# Patient Record
Sex: Female | Born: 1970 | Race: White | Hispanic: No | Marital: Married | Smoking: Never smoker
Health system: Southern US, Community
[De-identification: ages and names within clinical notes are randomized; demographics above are authoritative.]

## PROBLEM LIST (undated history)

## (undated) DIAGNOSIS — E2839 Other primary ovarian failure: Secondary | ICD-10-CM

## (undated) DIAGNOSIS — G43909 Migraine, unspecified, not intractable, without status migrainosus: Secondary | ICD-10-CM

## (undated) DIAGNOSIS — Z8669 Personal history of other diseases of the nervous system and sense organs: Secondary | ICD-10-CM

## (undated) DIAGNOSIS — O09529 Supervision of elderly multigravida, unspecified trimester: Secondary | ICD-10-CM

## (undated) DIAGNOSIS — R87619 Unspecified abnormal cytological findings in specimens from cervix uteri: Secondary | ICD-10-CM

## (undated) DIAGNOSIS — Z789 Other specified health status: Secondary | ICD-10-CM

## (undated) DIAGNOSIS — G43829 Menstrual migraine, not intractable, without status migrainosus: Secondary | ICD-10-CM

## (undated) DIAGNOSIS — Z9889 Other specified postprocedural states: Secondary | ICD-10-CM

## (undated) DIAGNOSIS — M810 Age-related osteoporosis without current pathological fracture: Secondary | ICD-10-CM

## (undated) DIAGNOSIS — E039 Hypothyroidism, unspecified: Secondary | ICD-10-CM

## (undated) DIAGNOSIS — R112 Nausea with vomiting, unspecified: Secondary | ICD-10-CM

## (undated) HISTORY — DX: Menstrual migraine, not intractable, without status migrainosus: G43.829

## (undated) HISTORY — DX: Personal history of other diseases of the nervous system and sense organs: Z86.69

## (undated) HISTORY — DX: Supervision of elderly multigravida, unspecified trimester: O09.529

## (undated) HISTORY — DX: Age-related osteoporosis without current pathological fracture: M81.0

## (undated) HISTORY — DX: Hypothyroidism, unspecified: E03.9

## (undated) HISTORY — PX: WISDOM TOOTH EXTRACTION: SHX21

## (undated) HISTORY — DX: Other primary ovarian failure: E28.39

## (undated) HISTORY — PX: GYNECOLOGIC CRYOSURGERY: SHX857

---

## 1998-09-06 ENCOUNTER — Other Ambulatory Visit: Admission: RE | Admit: 1998-09-06 | Discharge: 1998-09-06 | Payer: Self-pay | Admitting: *Deleted

## 1999-09-11 ENCOUNTER — Other Ambulatory Visit: Admission: RE | Admit: 1999-09-11 | Discharge: 1999-09-11 | Payer: Self-pay | Admitting: *Deleted

## 2000-09-19 ENCOUNTER — Other Ambulatory Visit: Admission: RE | Admit: 2000-09-19 | Discharge: 2000-09-19 | Payer: Self-pay | Admitting: *Deleted

## 2001-09-22 ENCOUNTER — Other Ambulatory Visit: Admission: RE | Admit: 2001-09-22 | Discharge: 2001-09-22 | Payer: Self-pay | Admitting: *Deleted

## 2002-09-28 ENCOUNTER — Other Ambulatory Visit: Admission: RE | Admit: 2002-09-28 | Discharge: 2002-09-28 | Payer: Self-pay | Admitting: *Deleted

## 2006-02-28 ENCOUNTER — Emergency Department (HOSPITAL_COMMUNITY): Admission: EM | Admit: 2006-02-28 | Discharge: 2006-02-28 | Payer: Self-pay | Admitting: Family Medicine

## 2007-02-15 ENCOUNTER — Emergency Department (HOSPITAL_COMMUNITY): Admission: EM | Admit: 2007-02-15 | Discharge: 2007-02-15 | Payer: Self-pay | Admitting: Family Medicine

## 2008-04-12 ENCOUNTER — Encounter: Admission: RE | Admit: 2008-04-12 | Discharge: 2008-04-12 | Payer: Self-pay | Admitting: Family Medicine

## 2009-06-29 ENCOUNTER — Encounter: Admission: RE | Admit: 2009-06-29 | Discharge: 2009-06-29 | Payer: Self-pay | Admitting: Obstetrics & Gynecology

## 2010-06-28 ENCOUNTER — Other Ambulatory Visit: Payer: Self-pay | Admitting: Obstetrics & Gynecology

## 2010-06-28 DIAGNOSIS — Z1231 Encounter for screening mammogram for malignant neoplasm of breast: Secondary | ICD-10-CM

## 2010-07-05 ENCOUNTER — Ambulatory Visit
Admission: RE | Admit: 2010-07-05 | Discharge: 2010-07-05 | Disposition: A | Discharge status: Home / Self Care | Payer: BC Managed Care – PPO | Source: Ambulatory Visit | Attending: Obstetrics & Gynecology | Admitting: Obstetrics & Gynecology

## 2010-07-05 DIAGNOSIS — Z1231 Encounter for screening mammogram for malignant neoplasm of breast: Secondary | ICD-10-CM

## 2010-07-25 ENCOUNTER — Other Ambulatory Visit (HOSPITAL_COMMUNITY): Payer: Self-pay | Admitting: Obstetrics & Gynecology

## 2010-07-25 DIAGNOSIS — N979 Female infertility, unspecified: Secondary | ICD-10-CM

## 2010-07-31 ENCOUNTER — Ambulatory Visit (HOSPITAL_COMMUNITY)
Admission: RE | Admit: 2010-07-31 | Discharge: 2010-07-31 | Disposition: A | Discharge status: Home / Self Care | Payer: BC Managed Care – PPO | Source: Ambulatory Visit | Attending: Obstetrics & Gynecology | Admitting: Obstetrics & Gynecology

## 2010-07-31 DIAGNOSIS — N979 Female infertility, unspecified: Secondary | ICD-10-CM | POA: Insufficient documentation

## 2010-11-01 ENCOUNTER — Encounter: Payer: Self-pay | Admitting: Obstetrics & Gynecology

## 2010-11-09 ENCOUNTER — Encounter: Payer: Self-pay | Admitting: Obstetrics & Gynecology

## 2010-11-26 ENCOUNTER — Inpatient Hospital Stay (HOSPITAL_COMMUNITY): Payer: BC Managed Care – PPO

## 2010-11-26 ENCOUNTER — Inpatient Hospital Stay (HOSPITAL_COMMUNITY)
Admission: AD | Admit: 2010-11-26 | Discharge: 2010-11-26 | Disposition: A | Discharge status: Home / Self Care | Payer: BC Managed Care – PPO | Source: Ambulatory Visit | Attending: Obstetrics and Gynecology | Admitting: Obstetrics and Gynecology

## 2010-11-26 ENCOUNTER — Encounter (HOSPITAL_COMMUNITY): Payer: Self-pay | Admitting: *Deleted

## 2010-11-26 DIAGNOSIS — O26859 Spotting complicating pregnancy, unspecified trimester: Secondary | ICD-10-CM | POA: Insufficient documentation

## 2010-11-26 HISTORY — DX: Unspecified abnormal cytological findings in specimens from cervix uteri: R87.619

## 2010-11-26 HISTORY — DX: Migraine, unspecified, not intractable, without status migrainosus: G43.909

## 2010-11-26 NOTE — Progress Notes (Signed)
Has had several u/s so far, has h/o MAB, has established IUP with cardiac activity on 11/09/10.  Bleeding began today at 1530, cramping.

## 2010-11-26 NOTE — H&P (Signed)
CC; spotting HPI: 40 yo w/ desired Clomid preg, G2P0 and h/o 5 wk MAB presents w/ spotting and minimal cramping. Pt w/ documented viable IUP around 6 wks but given new sx, concerned about viability of preg. Pain controlled, bleeding minimal. No HA, CP, dizziness.  Meds: oral progesterone, 81 mg ASA NKDA PMH: none per pt  PE: Filed Vitals:   11/26/10 1641  BP: 122/76  Pulse: 80  Temp: 98.4 F (36.9 C)  Resp: 18   Gen: well appearing CV: RRR Pulm: CTAB Abd: soft, NT, ND GU: cvx long/ closed/ 1 scopette dark brown blood, no active bleeding. No adnexal masses, no cervical or uterine tenderness LE: NT, no edema  Rh pos (from office) U/s: Viable IUP. 1.5 cm [facility]  A/P: 40 yo G2P0 w/ h/o 5 wk MAB here at 9 wks by early u/s w/ spotting and viable IUP w/ [facility] - reassurance given. - f/u in office 1 wk  Djon Tith A. 11/26/2010 6:59 PM

## 2010-11-26 NOTE — Progress Notes (Signed)
Pt reports having some spotting/bleeding when she wiped after going to the bathroom. Also report mild cramping.

## 2010-12-06 ENCOUNTER — Other Ambulatory Visit: Payer: Self-pay

## 2010-12-07 LAB — GC/CHLAMYDIA PROBE AMP, GENITAL: Gonorrhea: NEGATIVE

## 2010-12-07 LAB — ABO/RH: RH Type: POSITIVE

## 2010-12-19 ENCOUNTER — Other Ambulatory Visit (HOSPITAL_COMMUNITY): Payer: Self-pay | Admitting: Obstetrics & Gynecology

## 2010-12-19 DIAGNOSIS — O09529 Supervision of elderly multigravida, unspecified trimester: Secondary | ICD-10-CM

## 2010-12-19 DIAGNOSIS — O289 Unspecified abnormal findings on antenatal screening of mother: Secondary | ICD-10-CM

## 2010-12-21 ENCOUNTER — Encounter (HOSPITAL_COMMUNITY): Payer: Self-pay | Admitting: *Deleted

## 2010-12-21 ENCOUNTER — Ambulatory Visit (HOSPITAL_COMMUNITY)
Admission: RE | Admit: 2010-12-21 | Discharge: 2010-12-21 | Disposition: A | Discharge status: Home / Self Care | Payer: BC Managed Care – PPO | Source: Ambulatory Visit | Attending: Obstetrics & Gynecology | Admitting: Obstetrics & Gynecology

## 2010-12-21 ENCOUNTER — Encounter (HOSPITAL_COMMUNITY): Payer: Self-pay | Admitting: Pharmacist

## 2010-12-21 ENCOUNTER — Encounter (HOSPITAL_COMMUNITY): Payer: Self-pay

## 2010-12-21 ENCOUNTER — Encounter: Payer: Self-pay | Admitting: Obstetrics and Gynecology

## 2010-12-21 DIAGNOSIS — O3440 Maternal care for other abnormalities of cervix, unspecified trimester: Secondary | ICD-10-CM | POA: Insufficient documentation

## 2010-12-21 DIAGNOSIS — O09529 Supervision of elderly multigravida, unspecified trimester: Secondary | ICD-10-CM | POA: Insufficient documentation

## 2010-12-21 DIAGNOSIS — O289 Unspecified abnormal findings on antenatal screening of mother: Secondary | ICD-10-CM

## 2010-12-21 NOTE — Progress Notes (Signed)
Genetic Counseling  High-Risk Gestation Note  Appointment Date:  12/21/2010 Referred By: Genia Del, MD Date of Birth:  January 06, 1971 Partner:  Tiffany Webb   Pregnancy History: [id number] Estimated Date of Delivery: 06/29/11 Estimated Gestational Age: [redacted]w[redacted]d Attending: Dario Ave, MD   [name] and her partner, [name] were seen for genetic counseling regarding a screen positive Down syndrome risk from [facility] (cell free fetal DNA testing). The patient will be 40 years old at delivery.   They were counseled regarding maternal age and the association with risk for chromosome conditions due to nondisjunction with aging of the ova.   We reviewed chromosomes, nondisjunction, and the associated 1 in 13 risk for fetal aneuploidy in the first trimester related to a maternal age of 59 at delivery.  They were counseled that the risk for aneuploidy decreases as gestational age increases, accounting for those pregnancies which spontaneously abort.  We specifically discussed Down syndrome (trisomy 75), trisomies 93 and 48, and sex chromosome aneuploidies (47,XXX and 47,XXY) including the common features and prognoses of each.   We reviewed Tiffany Webb's positive non-invasive prenatal testing (NIPT) [facility] (circulating cell free fetal DNA) screen result and the associated increase in risk for fetal Down syndrome of >99%.  In addition, we reviewed the less than 1 in 10,000 risk for trisomy 53 and trisomy 13 from this screen. This screen utilizes cell free fetal DNA which is found in the maternal circulation. This test is not diagnostic for chromosome conditions, but can provide information regarding the presence or absence of extra fetal DNA for chromosomes 13, 18 and 21. The reported detection rate for Trisomy 21, and Trisomy 18 is greater than 99%, and is approximately 91% for Trisomy 13. The false positive rate is thought to be less than 1% for any of these conditions.     They were  counseled regarding diagnostic options including CVS and amniocentesis.  The risks, benefits, and limitations of each of these options were reviewed in detail. A risk of 1 in 100 was given for CVS and 1 in 200-300 was given for amniocentesis, the primary concern being spontaneous pregnancy loss. We also reviewed screening option of detailed ultrasound performed in the second trimester. Ultrasound cannot diagnose or rule out chromosome conditions. They understand that these options cannot identify or rule out all birth defects or genetic conditions.  After thoughtful consideration of these options, they elected to proceed with chorionic villus sampling (CVS) at the time of today's visit. FISH analysis was requested.   An ultrasound was performed today.  The ultrasound report will be sent under separate cover.    Once a pregnancy is found to have Down syndrome, there are different ways to proceed with the pregnancy. If the pregnancy is continued, it would likely be followed via ultrasounds and a fetal echocardiogram (detailed ultrasound of the fetal heart). Some parents choose to pursue adoption as an option. It is also an option to end the pregnancy until [redacted] weeks gestation in the state of [place]. The couple stated that they are planning to pursue termination of the pregnancy in the event that a diagnosis of Down syndrome is determined from CVS.   Tiffany Webb was provided with written information regarding cystic fibrosis (CF) including the carrier frequency and incidence in the Caucasian population, the availability of carrier testing and prenatal diagnosis if indicated.  In addition, we discussed that CF is routinely screened for as part of the [place] newborn screening panel.  She elected  to proceed with CF carrier screening today.   Both family histories were reviewed and found to be noncontributory for birth defects, mental retardation, and known genetic conditions. Without further information  regarding the provided family history, an accurate genetic risk cannot be calculated. Further genetic counseling is warranted if more information is obtained.  Tiffany Webb denied exposure to environmental toxins or chemical agents. She denied the use of alcohol, tobacco or street drugs. She denied significant viral illnesses during the course of her pregnancy. Her medical and surgical histories were noncontributory.     I counseled this couple regarding the above risks and available options.  The approximate face-to-face time with the genetic counselor was 40 minutes.    Tiffany Braun Charlena Haub, MS, [facility] 12/21/2010

## 2010-12-21 NOTE — Progress Notes (Signed)
Report in [facility]/EPIC; follow-up as needed 

## 2010-12-21 NOTE — Progress Notes (Signed)
Report in [facility]/EPIC; follow-up as needed Transabdominal CVS performed without apparent abnormalities.

## 2010-12-22 ENCOUNTER — Telehealth (HOSPITAL_COMMUNITY): Payer: Self-pay | Admitting: MS"

## 2010-12-22 NOTE — Telephone Encounter (Signed)
Called Tiffany Webb to discuss the preliminary FISH results from her CVS. We reviewed that these are within normal limits. Patient gave permission to disclose fetal gender. Reported apparently female.  Patient expressed surprise that this is not consistent with NIPT ([facility]) result. She previously had termination planned for Thursday given expectation that CVS would confirm NIPT result. She is planning to cancel this appointment given that the final result will not likely be available prior to next Thursday.    We discussed that the maternal cell contamination studies are being performed and are anticipated to result next week. We again discussed the limitations of FISH and that final results are still pending and will be available in approximately 7-10 days.  All questions were answered to her satisfaction, she was encouraged to call with additional questions or concerns.  Quinn Plowman MS Patent attorney

## 2010-12-27 ENCOUNTER — Telehealth (HOSPITAL_COMMUNITY): Payer: Self-pay | Admitting: MS"

## 2010-12-27 NOTE — Telephone Encounter (Signed)
Maternal cell contamination molecular studies negative, indicating that the cells analyzed from CVS sample are fetal in origin. Result to patient. Final karyotype pending.

## 2010-12-28 ENCOUNTER — Ambulatory Visit (HOSPITAL_COMMUNITY)
Admission: RE | Admit: 2010-12-28 | Payer: BC Managed Care – PPO | Source: Ambulatory Visit | Admitting: Obstetrics & Gynecology

## 2010-12-28 ENCOUNTER — Encounter (HOSPITAL_COMMUNITY): Admission: RE | Payer: Self-pay | Source: Ambulatory Visit

## 2010-12-28 HISTORY — DX: Other specified health status: Z78.9

## 2010-12-28 HISTORY — DX: Other specified postprocedural states: Z98.890

## 2010-12-28 HISTORY — DX: Nausea with vomiting, unspecified: R11.2

## 2010-12-28 SURGERY — DILATION AND EVACUATION, UTERUS, SECOND TRIMESTER
Anesthesia: Choice

## 2011-01-01 ENCOUNTER — Telehealth (HOSPITAL_COMMUNITY): Payer: Self-pay | Admitting: MS"

## 2011-01-01 ENCOUNTER — Other Ambulatory Visit (HOSPITAL_COMMUNITY): Payer: Self-pay | Admitting: Obstetrics & Gynecology

## 2011-01-01 DIAGNOSIS — O09529 Supervision of elderly multigravida, unspecified trimester: Secondary | ICD-10-CM

## 2011-01-01 NOTE — Telephone Encounter (Signed)
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 Discussed with patient that this indicates that the majority of cells analyzed consistent with normal female chromosomes. This is estimated to be less than 5% mosaicism for trisomy 28. 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 It could also indicate confined placental mosaicism for a +21 cell line. We discussed the option of amniocentesis for chromosome and FISH analysis to confirm results. Patient expressed interest in amniocentesis. We reviewed the options of early amniocentesis (prior to 15 weeks) and amniocentesis after [redacted] weeks gestation. The risks, benefits, and limitations were reviewed. Patient understands that the risk of complications from amniocentesis is higher if performed prior to [redacted] weeks gestation. After careful consideration, [name] elected to return to our office for amniocentesis tomorrow (01/02/11).

## 2011-01-02 ENCOUNTER — Ambulatory Visit (HOSPITAL_COMMUNITY)
Admission: RE | Admit: 2011-01-02 | Discharge: 2011-01-02 | Disposition: A | Discharge status: Home / Self Care | Payer: BC Managed Care – PPO | Source: Ambulatory Visit | Attending: Obstetrics & Gynecology | Admitting: Obstetrics & Gynecology

## 2011-01-02 DIAGNOSIS — O3440 Maternal care for other abnormalities of cervix, unspecified trimester: Secondary | ICD-10-CM | POA: Insufficient documentation

## 2011-01-02 DIAGNOSIS — O09529 Supervision of elderly multigravida, unspecified trimester: Secondary | ICD-10-CM | POA: Insufficient documentation

## 2011-01-03 ENCOUNTER — Encounter (HOSPITAL_COMMUNITY): Payer: Self-pay | Admitting: Pharmacist

## 2011-01-03 ENCOUNTER — Telehealth (HOSPITAL_COMMUNITY): Payer: Self-pay | Admitting: MS"

## 2011-01-03 ENCOUNTER — Other Ambulatory Visit: Payer: Self-pay | Admitting: Obstetrics & Gynecology

## 2011-01-03 NOTE — Telephone Encounter (Signed)
Called Tiffany Webb to discuss the preliminary FISH results from her amniocentesis We reviewed that these indicate 18% mosaicism for trisomy 47. We reviewed that this result indicates mosaicism in the fetus. We again discussed the limitations of FISH and that final results are still pending and will be available in 1-2 weeks.   We discussed that there can be wide variability in the features that may or may not be present with mosaic Down syndrome. We discussed the limitations in predicting phenotype prenatally in the case of mosaic Trisomy 21.  Typically, a higher percentage of trisomic cells (high level of mosaicism) would increase the risk for associated features in the baby.  Chromosome mosaicism increases the risk for miscarriage as well; higher levels of mosaicism would be associated with a higher chance of miscarriage.  However, the limitation is that the level of mosaicism can be different in different tissue types (ie. blood cells, heart cells, brain cells, etc.), which we are unable to predict. Patient indicated that she was planning to proceed with termination of pregnancy. She planned to contact her OB office for follow-up.  All questions were answered to her satisfaction, she was encouraged to call with additional questions or concerns.

## 2011-01-05 ENCOUNTER — Encounter (HOSPITAL_COMMUNITY): Payer: Self-pay

## 2011-01-08 ENCOUNTER — Encounter (HOSPITAL_COMMUNITY): Payer: Self-pay | Admitting: Certified Registered"

## 2011-01-08 ENCOUNTER — Ambulatory Visit (HOSPITAL_COMMUNITY)
Admission: RE | Admit: 2011-01-08 | Discharge: 2011-01-08 | Disposition: A | Discharge status: Home / Self Care | Payer: BC Managed Care – PPO | Source: Ambulatory Visit | Attending: Obstetrics & Gynecology | Admitting: Obstetrics & Gynecology

## 2011-01-08 ENCOUNTER — Ambulatory Visit (HOSPITAL_COMMUNITY): Payer: BC Managed Care – PPO | Admitting: Certified Registered"

## 2011-01-08 ENCOUNTER — Ambulatory Visit (HOSPITAL_COMMUNITY): Payer: BC Managed Care – PPO

## 2011-01-08 ENCOUNTER — Other Ambulatory Visit: Payer: Self-pay | Admitting: Obstetrics & Gynecology

## 2011-01-08 ENCOUNTER — Encounter (HOSPITAL_COMMUNITY): Payer: Self-pay | Admitting: *Deleted

## 2011-01-08 ENCOUNTER — Encounter (HOSPITAL_COMMUNITY)
Admission: RE | Disposition: A | Discharge status: Home / Self Care | Payer: Self-pay | Source: Ambulatory Visit | Attending: Obstetrics & Gynecology

## 2011-01-08 DIAGNOSIS — O039 Complete or unspecified spontaneous abortion without complication: Secondary | ICD-10-CM | POA: Insufficient documentation

## 2011-01-08 DIAGNOSIS — O3510X0 Maternal care for (suspected) chromosomal abnormality in fetus, unspecified, not applicable or unspecified: Secondary | ICD-10-CM | POA: Insufficient documentation

## 2011-01-08 DIAGNOSIS — O351XX0 Maternal care for (suspected) chromosomal abnormality in fetus, not applicable or unspecified: Secondary | ICD-10-CM | POA: Insufficient documentation

## 2011-01-08 HISTORY — PX: DILATION AND EVACUATION: SHX1459

## 2011-01-08 LAB — CBC
Hemoglobin: 11.4 g/dL — ABNORMAL LOW (ref 12.0–15.0)
MCH: 31 pg (ref 26.0–34.0)
MCHC: 33.1 g/dL (ref 30.0–36.0)

## 2011-01-08 LAB — ABO/RH: ABO/RH(D): AB POS

## 2011-01-08 SURGERY — DILATION AND EVACUATION, UTERUS, SECOND TRIMESTER
Anesthesia: Choice | Site: Vagina | Wound class: Clean Contaminated

## 2011-01-08 MED ORDER — LIDOCAINE HCL 1 % IJ SOLN
INTRAMUSCULAR | Status: DC | PRN
  Administered 2011-01-08: 20 mL

## 2011-01-08 MED ORDER — FENTANYL CITRATE 0.05 MG/ML IJ SOLN
INTRAMUSCULAR | Status: DC | PRN
  Administered 2011-01-08 (×4): 25 ug via INTRAVENOUS

## 2011-01-08 MED ORDER — FENTANYL CITRATE 0.05 MG/ML IJ SOLN
INTRAMUSCULAR | Status: AC
  Filled 2011-01-08: qty 2

## 2011-01-08 MED ORDER — HYDROCODONE-ACETAMINOPHEN 5-500 MG PO TABS: 1.0000 | ORAL_TABLET | Freq: Four times a day (QID) | ORAL | Status: AC | PRN

## 2011-01-08 MED ORDER — KETOROLAC TROMETHAMINE 30 MG/ML IJ SOLN
INTRAMUSCULAR | Status: AC
  Filled 2011-01-08: qty 1

## 2011-01-08 MED ORDER — GLYCOPYRROLATE 0.2 MG/ML IJ SOLN
INTRAMUSCULAR | Status: DC | PRN
  Administered 2011-01-08: .15 mg via INTRAVENOUS

## 2011-01-08 MED ORDER — LIDOCAINE HCL (CARDIAC) 20 MG/ML IV SOLN
INTRAVENOUS | Status: AC
  Filled 2011-01-08: qty 5

## 2011-01-08 MED ORDER — HYDROMORPHONE HCL PF 1 MG/ML IJ SOLN
INTRAMUSCULAR | Status: AC
  Filled 2011-01-08: qty 1

## 2011-01-08 MED ORDER — LACTATED RINGERS IV SOLN
INTRAVENOUS | Status: DC
  Administered 2011-01-08 (×2): via INTRAVENOUS

## 2011-01-08 MED ORDER — GLYCOPYRROLATE 0.2 MG/ML IJ SOLN
INTRAMUSCULAR | Status: AC
  Filled 2011-01-08: qty 1

## 2011-01-08 MED ORDER — ONDANSETRON HCL 4 MG/2ML IJ SOLN
INTRAMUSCULAR | Status: DC | PRN
  Administered 2011-01-08: 4 mg via INTRAVENOUS

## 2011-01-08 MED ORDER — MIDAZOLAM HCL 5 MG/5ML IJ SOLN
INTRAMUSCULAR | Status: DC | PRN
  Administered 2011-01-08: 2 mg via INTRAVENOUS

## 2011-01-08 MED ORDER — METOCLOPRAMIDE HCL 5 MG/ML IJ SOLN: 10.0000 mg | Freq: Once | INTRAMUSCULAR | Status: DC | PRN

## 2011-01-08 MED ORDER — SCOPOLAMINE 1 MG/3DAYS TD PT72
1.0000 | MEDICATED_PATCH | Freq: Once | TRANSDERMAL | Status: DC
  Administered 2011-01-08: 1.5 mg via TRANSDERMAL

## 2011-01-08 MED ORDER — PROPOFOL 10 MG/ML IV EMUL
INTRAVENOUS | Status: AC
  Filled 2011-01-08: qty 20

## 2011-01-08 MED ORDER — CEFAZOLIN SODIUM 1-5 GM-% IV SOLN
INTRAVENOUS | Status: DC | PRN
  Administered 2011-01-08: 1 g via INTRAVENOUS

## 2011-01-08 MED ORDER — ONDANSETRON HCL 4 MG/2ML IJ SOLN
INTRAMUSCULAR | Status: AC
  Filled 2011-01-08: qty 2

## 2011-01-08 MED ORDER — DEXAMETHASONE SODIUM PHOSPHATE 10 MG/ML IJ SOLN
INTRAMUSCULAR | Status: AC
  Filled 2011-01-08: qty 1

## 2011-01-08 MED ORDER — MIDAZOLAM HCL 2 MG/2ML IJ SOLN
INTRAMUSCULAR | Status: AC
  Filled 2011-01-08: qty 2

## 2011-01-08 MED ORDER — FENTANYL CITRATE 0.05 MG/ML IJ SOLN: 25.0000 ug | INTRAMUSCULAR | Status: DC | PRN

## 2011-01-08 MED ORDER — SCOPOLAMINE 1 MG/3DAYS TD PT72
MEDICATED_PATCH | TRANSDERMAL | Status: AC
  Administered 2011-01-08: 1.5 mg via TRANSDERMAL
  Filled 2011-01-08: qty 1

## 2011-01-08 MED ORDER — DEXAMETHASONE SODIUM PHOSPHATE 4 MG/ML IJ SOLN
INTRAMUSCULAR | Status: DC | PRN
  Administered 2011-01-08: 4 mg via INTRAVENOUS

## 2011-01-08 MED ORDER — LIDOCAINE HCL (CARDIAC) 20 MG/ML IV SOLN
INTRAVENOUS | Status: DC | PRN
  Administered 2011-01-08: 50 mg via INTRAVENOUS

## 2011-01-08 MED ORDER — PROPOFOL 10 MG/ML IV EMUL
INTRAVENOUS | Status: DC | PRN
  Administered 2011-01-08: 30 mg via INTRAVENOUS
  Administered 2011-01-08: 170 mg via INTRAVENOUS

## 2011-01-08 SURGICAL SUPPLY — 17 items
CLOTH BEACON ORANGE TIMEOUT ST (SAFETY) ×2 IMPLANT
CONT PATH 16OZ SNAP LID 3702 (MISCELLANEOUS) ×2 IMPLANT
DRAPE STERI URO 9X17 APER PCH (DRAPES) IMPLANT
GLOVE BIO SURGEON STRL SZ 6.5 (GLOVE) ×4 IMPLANT
GOWN PREVENTION PLUS LG XLONG (DISPOSABLE) ×4 IMPLANT
KIT BERKELEY 2ND TRIMESTER 1/2 (COLLECTOR) ×2 IMPLANT
NEEDLE SPNL 22GX3.5 QUINCKE BK (NEEDLE) ×2 IMPLANT
PACK VAGINAL MINOR WOMEN LF (CUSTOM PROCEDURE TRAY) ×2 IMPLANT
PAD PREP 24X48 CUFFED NSTRL (MISCELLANEOUS) ×2 IMPLANT
SYR CONTROL 10ML LL (SYRINGE) ×2 IMPLANT
TOP DISP BERKELEY (MISCELLANEOUS) ×2 IMPLANT
TOWEL OR 17X24 6PK STRL BLUE (TOWEL DISPOSABLE) ×4 IMPLANT
TUBE VACURETTE 2ND TRIMESTER (CANNULA) ×2 IMPLANT
VACURETTE 12 RIGID CVD (CANNULA) IMPLANT
VACURETTE 14MM CVD 1/2 BASE (CANNULA) IMPLANT
VACURETTE 16MM ASPIR CVD .5 (CANNULA) ×2 IMPLANT
WATER STERILE IRR 1000ML POUR (IV SOLUTION) ×2 IMPLANT

## 2011-01-08 NOTE — Op Note (Signed)
01/08/2011  11:42 AM  PATIENT:  Tiffany Webb  40 y.o. 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  She is prepped with Betadine on the suprapubic, vulvar and vaginal areas and draped as usual. A dose of Ancef 1 g IV was given. The vaginal exam revealed an anteverted uterus about 15 cm, mobile, no adnexal mass and the cervix appeared closed and long but was soft.  The patient was prepared with Cytotec 800 mcg intravaginally on November 25 at bed time.  The weighted speculum was introduced in the vagina. The anterior lip of the cervix was grasped with a tenaculum. A paracervical block was done at 4:00 and 8:00 with lidocaine 1%, 20 cc.  The cervix was dilated with Hegar dilators without any resistance up to #57. A #16 curved suction curette was used. The the dilation and extraction was done under ultrasound guidance.  We started with the suction curette, then used a fenestrated large clamp and very gently used the large sharp curet.  We finally I used the suction curette again to assure a thin endometrial lining at the end. The uterine cavity was confirmed to be empty  by ultrasound.  All instruments were removed. The uterus was contracting well and hemostasis was adequate. The patient was brought to recovery room in good stable status.  ESTIMATED BLOOD LOSS: 150 cc   Intake/Output Summary (Last 24 hours) at 01/08/11 1142 Last data filed at 01/08/11 1122  Gross per 24 hour  Intake   1000 ml  Output    200 ml  Net    800 ml     BLOOD ADMINISTERED:none   LOCAL MEDICATIONS USED:  LIDOCAINE 1 % 20 CC  SPECIMEN:  Source of Specimen:   Products of conception  DISPOSITION OF SPECIMEN:  PATHOLOGY  COUNTS:  YES   PLAN OF CARE: Transfer to PACU  [name]  01/08/11 at 11:45 am

## 2011-01-08 NOTE — Anesthesia Preprocedure Evaluation (Addendum)
Anesthesia Evaluation  Patient identified by MRN, date of birth, ID band Patient awake    Reviewed: Allergy & Precautions, H&P , NPO status , Patient's Chart, lab work & pertinent test results  History of Anesthesia Complications (+) PONV  Airway Mallampati: I TM Distance: >3 FB Neck ROM: full    Dental No notable dental hx. (+) Teeth Intact   Pulmonary neg pulmonary ROS,    Pulmonary exam normal       Cardiovascular neg cardio ROS     Neuro/Psych Negative Neurological ROS  Negative Psych ROS   GI/Hepatic negative GI ROS, Neg liver ROS,   Endo/Other  Negative Endocrine ROS  Renal/GU negative Renal ROS  Genitourinary negative   Musculoskeletal negative musculoskeletal ROS (+)   Abdominal Normal abdominal exam  (+)   Peds negative pediatric ROS (+)  Hematology negative hematology ROS (+)   Anesthesia Other Findings   Reproductive/Obstetrics negative OB ROS                           Anesthesia Physical Anesthesia Plan  ASA: II  Anesthesia Plan: General   Post-op Pain Management:    Induction: Intravenous  Airway Management Planned: LMA  Additional Equipment:   Intra-op Plan:   Post-operative Plan:   Informed Consent: I have reviewed the patients History and Physical, chart, labs and discussed the procedure including the risks, benefits and alternatives for the proposed anesthesia with the patient or authorized representative who has indicated his/her understanding and acceptance.     Plan Discussed with: CRNA  Anesthesia Plan Comments:         Anesthesia Quick Evaluation  

## 2011-01-08 NOTE — Anesthesia Postprocedure Evaluation (Signed)
Anesthesia Post Note  Patient: Tiffany Webb  Procedure(s) Performed:  DILATATION AND EVACUATION (D&E) 2ND TRIMESTER  Anesthesia type: General  Patient location: PACU  Post pain: Pain level controlled  Post assessment: Post-op Vital signs reviewed  Last Vitals:  Filed Vitals:   01/08/11 1200  BP: 121/76  Pulse: 80  Temp:   Resp: 16    Post vital signs: Reviewed  Level of consciousness: sedated  Complications: No apparent anesthesia complicationsfj

## 2011-01-08 NOTE — Discharge Summary (Signed)
  Physician Discharge Summary  Patient ID: Tiffany Webb MRN: [id number] DOB/AGE: 1970-05-14 40 y.o.  Admit date: 01/08/2011 Discharge date: 01/08/2011  Admission Diagnoses: Elective  Discharge Diagnoses: Elective        Active Problems:  * No active hospital problems. *    Discharged Condition: good  Hospital Course:   Consults: none  Treatments: surgery: D+Extraction  Disposition: Home or Self Care  Discharge Orders    Future Appointments: Provider: Department: Dept Phone: Center:   01/11/2011 9:00 AM [facility] [place] [facility] [phone] [facility]     Current Discharge Medication List    START taking these medications   Details  HYDROcodone-acetaminophen (VICODIN) 5-500 MG per tablet Take 1 tablet by mouth every 6 (six) hours as needed for pain. Qty: 30 tablet, Refills: 0      CONTINUE these medications which have NOT CHANGED   Details  acetaminophen (TYLENOL) 500 MG tablet Take 500 mg by mouth every 6 (six) hours as needed. For pain.     Prenatal-Na Feredta-FA-Omega 3 (CAVAN-EC SOD DHA) 30-1 & 440 MG MISC Take 2 tablets by mouth Daily. 1 tablet is PNV; 1 gelcap = DHA       Follow-up Information    Follow up with Tiffany Webb,Tiffany Webb in 3 weeks.   Contact information:   [address] [place] [address] [phone]          Signed: Genia Del 01/08/2011, 12:01 PM

## 2011-01-08 NOTE — Transfer of Care (Signed)
Immediate Anesthesia Transfer of Care Note  Patient: Tiffany Webb  Procedure(s) Performed:  DILATATION AND EVACUATION (D&E) 2ND TRIMESTER  Patient Location: PACU  Anesthesia Type: General  Level of Consciousness: awake, alert  and oriented  Airway & Oxygen Therapy: Patient Spontanous Breathing and Patient connected to face mask oxygen  Post-op Assessment: Report given to PACU RN and Post -op Vital signs reviewed and stable  Post vital signs: Reviewed and stable  Complications: No apparent anesthesia complications

## 2011-01-08 NOTE — H&P (Signed)
Tiffany Webb is an 40 y.o. female G2P0010 at 28 weeks  RP:  D+Extraction re fetus with Down Syndrome mosaicism  HPI:  Crampy and spotting post Cytotec 800 microgram intravaginally yesterday HS. Pertinent Gynecological History:  Blood transfusions: none OB History: G2P0010   Menstrual History:  Patient's last menstrual period was 09/15/2010.    Past Medical History  Diagnosis Date  . Migraines   . Abnormal Pap smear of cervix   . No pertinent past medical history   . PONV (postoperative nausea and vomiting)     Past Surgical History  Procedure Date  . Gynecologic cryosurgery   . Wisdom tooth extraction     No family history on file.  Social History:  reports that she has never smoked. She has never used smokeless tobacco. She reports that she does not drink alcohol or use illicit drugs.  Allergies:  Allergies  Allergen Reactions  . Avocado     GI upset   . Food Other (See Comments)    Pine nuts-cause some GI upset    Prescriptions prior to admission  Medication Sig Dispense Refill  . acetaminophen (TYLENOL) 500 MG tablet Take 500 mg by mouth every 6 (six) hours as needed. For pain.       [place] Feredta-FA-Omega 3 (CAVAN-EC SOD DHA) 30-1 & 440 MG MISC Take 2 tablets by mouth Daily. 1 tablet is PNV; 1 gelcap = DHA        Blood pressure 104/74, pulse 77, temperature 98.2 F (36.8 C), temperature source Oral, resp. rate 18, height 5\' 4"  (1.626 m), weight 54.432 kg (120 lb), last menstrual period 09/15/2010, SpO2 100.00%.  Uterus gravid 15 wks Amnio  Mosaicism Down Syndrome   Results for orders placed during the hospital encounter of 01/08/11 (from the past 24 hour(s))  ABO/RH     Status: Normal   Collection Time   01/08/11  8:58 AM      Component Value Range   ABO/RH(D) AB POS    CBC     Status: Abnormal   Collection Time   01/08/11  8:59 AM      Component Value Range   WBC 7.7  4.0 - 10.5 (K/uL)   RBC 3.68 (*) 3.87 - 5.11 (MIL/uL)   Hemoglobin  11.4 (*) 12.0 - 15.0 (g/dL)   HCT 45.4 (*) 09.8 - 46.0 (%)   MCV 93.5  78.0 - 100.0 (fL)   MCH 31.0  26.0 - 34.0 (pg)   MCHC 33.1  30.0 - 36.0 (g/dL)   RDW 11.9  14.7 - 82.9 (%)   Platelets 272  150 - 400 (K/uL)    No results found.  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  Surgery and risks reviewed.  Gergory Biello,MARIE-LYNE 01/08/2011, 10:46 AM

## 2011-01-08 NOTE — Preoperative (Signed)
Beta Blockers   Reason not to administer Beta Blockers:Not Applicable 

## 2011-01-09 ENCOUNTER — Encounter (HOSPITAL_COMMUNITY): Payer: Self-pay | Admitting: Obstetrics & Gynecology

## 2011-01-11 ENCOUNTER — Other Ambulatory Visit (HOSPITAL_COMMUNITY): Payer: BC Managed Care – PPO

## 2011-01-17 LAB — US OB COMP LESS 14 WKS

## 2011-01-22 ENCOUNTER — Other Ambulatory Visit: Payer: Self-pay

## 2011-01-25 ENCOUNTER — Telehealth (HOSPITAL_COMMUNITY): Payer: Self-pay | Admitting: MS"

## 2011-01-25 NOTE — Telephone Encounter (Signed)
Called [name] with cystic fibrosis carrier screening results. Carrier screening for cystic fibrosis yielded a normal/negative result for the 97 most common disease-causing mutations, meaning that the risk to be a CF carrier can be reduced from 1 in 25 to 1 in 343.   The patient inquired about additional testing that would be indicated to be performed preconceptionally. We reviewed the additional carrier screening option of Spinal Muscular Atrophy (SMA) given the reported Caucasian ancestry. SMA is a genetic condition that follows autosomal recessive inheritance, meaning both parents must be carriers in order for a pregnancy to have a 1 in 4 (25%) chance to inherit the condition. SMA is characterized by progressive weakness of voluntary muscles. The age of onset and severity of symptoms is variable, with the most common type leading to severe involvement by age 66 years. Approximately 1 in 35 individuals in the Caucasian population are carriers. Carrier screening detects approximately 95% of carriers in the Caucasian population. Therefore, screening can reduce but not eliminate the chance to be a carrier. If one partner is an SMA carrier, then carrier screening would be offered to the other partner. [name] declined SMA carrier testing at this time.   [name] was again encouraged to contact our office with additional questions. We are also available to review available screening and testing options for a future pregnancy.

## 2011-01-25 NOTE — Telephone Encounter (Signed)
Called [name] to discuss final karyotype analysis from amniocentesis, which revealed apparently normal chromosomes. We reviewed that the previous CVS karyotype and FISH analysis from amniocentesis both had identified mosaicism for trisomy 21 cells (approximately <5% from CVS and 18% from [facility] from amniocentesis). We discussed that the CVS and amniocentesis analyzed different cell types from the pregnancy. The combined amniocentesis FISH and final karyotype findings may be indicative of low level mosaicism. We reviewed that FISH analysis is performed on cells obtained from direct fluid and that final karyotype analysis is performed on cultured cells. We discussed the possibility that the cells obtained to culture may not have contained the trisomy 21 cell line. We again reviewed the limitations in assessing mosaicism given that we are not able to assess the level of mosaicism in all tissues.   The patient asked if there were factors that may help reduce the risk for recurrence, such as supplements. We reviewed that trisomy 59, including mosaic trisomy 37 is sporadic. We discussed that studies have not indicated medications or supplements that would decrease the risk for recurrence of trisomy in a pregnancy.   Once a couple has had one pregnancy with Trisomy 21, the risk of any extra chromosome condition (including Trisomy 88, Trisomy 68, Trisomy 43, sex chromosome conditions involving an extra X chromosome) in a future pregnancy is the greatest of the following figures: either 1% or a woman's age-related risk for a chromosome condition, if she will be over 35 at delivery.  However, a small percentage of women will have a higher risk for extra chromosome conditions in their pregnancies; there is no test to determine who these women will be. The risk for an extra chromosome condition in a pregnancy is approximately 1 in 67 for a women who will be 42 years old at delivery; again, this risk will change  with age. We reviewed available screening and testing options in a future pregnancy including non-invasive prenatal testing (NIPT), CVS, and amniocentesis, including the risks, benefits, and limitations. We also discussed the option of preimplantation genetic diagnosis for aneuploidy, which uses IVF and would allow for analysis of an embryo prior to transfer to the uterus. However, we discussed one limitation of PGD for aneuploidy is that typically only one or two cells are analyzed, which would not be able to rule out the possibility of mosaicism.   [name] inquired about whether or not peripheral blood chromosome analysis would be warranted for her partner and herself. Given that mosaic trisomy 21 is typically sporadic and no evidence of a chromosome rearrangement, such as a translocation was seen from CVS or amniocentesis, parental blood chromosome analysis would not be warranted. However, we discussed that we can facilitate blood chromosome analysis for the patient and her partner, if desired. [name] declined this testing at this time.   [name] also inquired about the results of cystic fibrosis carrier screening that were performed. I planned to call patient back once these results were available.   The patient and her partner may contact us with additional questions or concerns.

## 2011-01-26 ENCOUNTER — Other Ambulatory Visit: Payer: Self-pay

## 2011-06-06 DIAGNOSIS — N978 Female infertility of other origin: Secondary | ICD-10-CM | POA: Insufficient documentation

## 2011-06-28 ENCOUNTER — Other Ambulatory Visit: Payer: Self-pay | Admitting: Obstetrics & Gynecology

## 2011-06-28 DIAGNOSIS — Z1231 Encounter for screening mammogram for malignant neoplasm of breast: Secondary | ICD-10-CM

## 2011-07-30 ENCOUNTER — Ambulatory Visit: Payer: BC Managed Care – PPO

## 2011-08-14 ENCOUNTER — Ambulatory Visit
Admission: RE | Admit: 2011-08-14 | Discharge: 2011-08-14 | Disposition: A | Discharge status: Home / Self Care | Payer: BC Managed Care – PPO | Source: Ambulatory Visit | Attending: Obstetrics & Gynecology | Admitting: Obstetrics & Gynecology

## 2011-08-14 DIAGNOSIS — Z1231 Encounter for screening mammogram for malignant neoplasm of breast: Secondary | ICD-10-CM

## 2011-08-23 ENCOUNTER — Ambulatory Visit: Payer: BC Managed Care – PPO

## 2011-12-11 ENCOUNTER — Telehealth (HOSPITAL_COMMUNITY): Payer: Self-pay | Admitting: MS"

## 2011-12-11 NOTE — Telephone Encounter (Signed)
[name] called to review screening and testing options for fetal aneuploidy in pregnancy. She is current pregnant, approximately [redacted] weeks gestation. She stated that she and her partner had initially been considering pursuing chorionic villus sampling over screening, but given her age and their history trying to conceive, she is leaning towards first pursuing cell free fetal DNA testing, as she did in her past pregnancy. We reviewed some of the changes in the cell free fetal DNA testing and the current labs offering the test. We reviewed that detection rates for fetal aneuploidy (trisomies 21, 18, and 13) are fairly comparable. We reviewed that sex chromosome aneuploidy is now available through cell free fetal DNA testing. We discussed that [facility] is planning to add two additional trisomy conditions, as well as four microdeletion syndromes. We discussed that there would not be an expected increased risk for microdeletion syndromes based on maternal age. [name] stated that she was interested in testing but did not want to be overly anxious and do testing that was not necessarily warranted. I discussed that we do not yet have experience with this additional panel, given that the testing just became available clinically on Monday. We discussed that cell free fetal DNA testing is available through most labs starting at [redacted] weeks gestation, with turnaround time of approximately a week and a half to two weeks.   [name] stated that she was interested in both cell free fetal DNA testing and an ultrasound in the first trimester at approximately [redacted] weeks gestation for nuchal translucency assessment. She said that her OB office typically does one screen or the other (12 week ultrasound with first screen or [facility]). Discussed that there are no concrete guidelines, but it makes sense why an office would not necessarily do both. The NT measurement was designed to screen for fetal aneuploidy, and cell free  fetal DNA testing is a better screen for fetal trisomy conditions. However, there is still potential added benefit in performing first trimester NT assessment, given that an increased measurement can also be indicative of additional underlying concerns, such as congenital heart defects or single gene conditions. Reviewed with [name] that we would be able to perform first trimester ultrasound NT assessment and/or cell free fetal DNA testing through our office, if desired. She also expressed interest in early second trimester anatomy scan (prior to [redacted] weeks gestation). Discussed that we would be able to facilitate this through our office, with the understanding that a follow-up ultrasound would likely be needed to complete fetal anatomic survey. The patient has an OB visit planned with her primary OB on 11/12, at which point she states she will be [redacted]w[redacted]d gestation. We discussed that this would be an appropriate gestation to pursue cell free fetal DNA testing, either through her OB office or our office. I planned to obtain additional information about the additional conditions screened through [facility]'s expanded cell free fetal DNA testing panel and contact the patient next week to further discuss the plan for screening and testing in the current pregnancy.    Clydie Braun Anthonee Gelin 12/11/2011 4:18 PM

## 2011-12-13 LAB — OB RESULTS CONSOLE RUBELLA ANTIBODY, IGM: Rubella: IMMUNE

## 2011-12-13 LAB — OB RESULTS CONSOLE HIV ANTIBODY (ROUTINE TESTING): HIV: NONREACTIVE

## 2011-12-13 LAB — OB RESULTS CONSOLE RPR: RPR: NONREACTIVE

## 2011-12-20 ENCOUNTER — Telehealth (HOSPITAL_COMMUNITY): Payer: Self-pay | Admitting: MS"

## 2011-12-20 NOTE — Telephone Encounter (Signed)
Patient called to review cell free fetal DNA testing options in pregnancy. We had previously discussed [facility] adding the capability to screen for additional conditions in their cell free fetal DNA testing (MaterniT21 Enhanced Sequencing Series). Reviewed that two of the conditions added by this laboratory are trisomy 64 and trisomy 38, which typically result in miscarriage and rarely in livebirth. The additional 4 conditions added are microdeletion syndromes, which are not associated with maternal age in pregnancy. The reported sensitivity for these additional conditions is 94% from the lab and specificity is 99%. Reviewed the detection rates for Harmony vs. MaterniT21. Mrs. Vonita Moss stated that she does not necessarily want to unnecessarily worry about additional conditions that the pregnancy would not necessarily have an increased risk for. However, it would also be potentially beneficial to screen for additional conditions, if possible. She discussed that her OB is also planning to perform a nuchal translucency assessment via ultrasound. Mrs. Vonita Moss has an OB visit next Tuesday and will plan to pursue cell free fetal DNA testing at that time. We also reviewed the option of an early anatomy ultrasound at approximately [redacted] weeks gestation, which we will be able to facilitate.   Clydie Braun Lyall Faciane 12/20/2011

## 2011-12-21 ENCOUNTER — Telehealth (HOSPITAL_COMMUNITY): Payer: Self-pay | Admitting: MS"

## 2011-12-21 NOTE — Telephone Encounter (Signed)
Patient called to say her OB office is not yet offering MaterniT21 but plan to in the near future. Dafna asked if she could pursue this lab through our office. Discussed that this is not a problem. Appointment made for lab draw on 12/25/11 at 8:30.   Clydie Braun Kate Larock 12/21/2011

## 2011-12-25 ENCOUNTER — Ambulatory Visit (HOSPITAL_COMMUNITY)
Admission: RE | Admit: 2011-12-25 | Discharge: 2011-12-25 | Disposition: A | Discharge status: Home / Self Care | Payer: BC Managed Care – PPO | Source: Ambulatory Visit | Attending: Obstetrics & Gynecology | Admitting: Obstetrics & Gynecology

## 2011-12-25 ENCOUNTER — Encounter (HOSPITAL_COMMUNITY): Payer: Self-pay

## 2011-12-25 DIAGNOSIS — O09529 Supervision of elderly multigravida, unspecified trimester: Secondary | ICD-10-CM | POA: Insufficient documentation

## 2011-12-25 DIAGNOSIS — O358XX0 Maternal care for other (suspected) fetal abnormality and damage, not applicable or unspecified: Secondary | ICD-10-CM | POA: Insufficient documentation

## 2011-12-25 LAB — OB RESULTS CONSOLE GC/CHLAMYDIA
Chlamydia: NEGATIVE
Gonorrhea: NEGATIVE

## 2012-01-03 ENCOUNTER — Telehealth (HOSPITAL_COMMUNITY): Payer: Self-pay | Admitting: MS"

## 2012-01-03 NOTE — Telephone Encounter (Signed)
Called Tiffany Webb to discuss her MaterniT21 (enhanced sequencing series), cell free fetal DNA testing. Testing was offered because of advanced maternal age. We reviewed that these are within normal limits (Negative), showing the expected representation of chromosome 21, 18, and 13. We reviewed that this testing identifies > 99% of pregnancies with trisomy 90 and trisomy 67, and approximately 91% of pregnancies with trisomy 53; the false positive rate is <0.1% for all conditions. Testing was also performed for X and Y chromosome analysis. This did not show evidence of aneuploidy for X or Y. It was also consistent with female gender. Y analysis has a detection rate of approximately 99.4%. She understands that this testing is not considered diagnostic for these conditions and does not identify all genetic or chromosome conditions.   [name] reported that nuchal translucency assessment is scheduled in her OB office on Monday. The patient is also interested in an anatomy ultrasound in the early second trimester through our office at approximately 16 weeks. She understands that fetal anatomic survey is more likely to be limited by early gestational age. Targeted ultrasound is scheduled in our office for 02/01/12 at 9:30 am. All questions were answered to her satisfaction, she was encouraged to call with additional questions or concerns.  Quinn Plowman, MS Certified Genetic Counselor 01/03/2012 4:38 PM

## 2012-01-08 ENCOUNTER — Telehealth (HOSPITAL_COMMUNITY): Payer: Self-pay | Admitting: MS"

## 2012-01-08 ENCOUNTER — Other Ambulatory Visit: Payer: Self-pay

## 2012-01-08 NOTE — Telephone Encounter (Signed)
Ms. Maylani Embree called regarding the scheduled early anatomy ultrasound on 12/20. Reviewed that Dr. Seymour Bars wanted to follow-up with her after the ultrasound but she is out of the office that day and the following week. Patient inquired about moving appointment time to 12/19. Discussed that one day would likely not change the likelihood of being able to complete anatomic survey, given early gestational age at the time regardless of when scan is performed. However, reviewed that a different physician is scheduled on 12/19, and patient had previously expressed interest in seeing Dr. Sherrie George given that she saw her in the previous pregnancy. Patient elected to keep previously scheduled ultrasound on 12/20 at this time. Encouraged her to call back if changed her mind. [name] also reported that her ultrasound was performed at her OB office yesterday and the nuchal translucency measurement appeared to be within normal range though official measurement was not obtained.   Clydie Braun Maelin Kurkowski 01/08/2012 11:27 AM

## 2012-01-29 ENCOUNTER — Other Ambulatory Visit (HOSPITAL_COMMUNITY): Payer: Self-pay | Admitting: Obstetrics & Gynecology

## 2012-01-29 DIAGNOSIS — Z3689 Encounter for other specified antenatal screening: Secondary | ICD-10-CM

## 2012-01-29 DIAGNOSIS — O3440 Maternal care for other abnormalities of cervix, unspecified trimester: Secondary | ICD-10-CM

## 2012-01-29 DIAGNOSIS — O09529 Supervision of elderly multigravida, unspecified trimester: Secondary | ICD-10-CM

## 2012-02-01 ENCOUNTER — Ambulatory Visit (HOSPITAL_COMMUNITY)
Admission: RE | Admit: 2012-02-01 | Discharge: 2012-02-01 | Disposition: A | Discharge status: Home / Self Care | Payer: BC Managed Care – PPO | Source: Ambulatory Visit | Attending: Obstetrics & Gynecology | Admitting: Obstetrics & Gynecology

## 2012-02-01 ENCOUNTER — Other Ambulatory Visit: Payer: Self-pay | Admitting: Maternal and Fetal Medicine

## 2012-02-01 ENCOUNTER — Encounter (HOSPITAL_COMMUNITY): Payer: Self-pay

## 2012-02-01 DIAGNOSIS — Z1389 Encounter for screening for other disorder: Secondary | ICD-10-CM | POA: Insufficient documentation

## 2012-02-01 DIAGNOSIS — O3440 Maternal care for other abnormalities of cervix, unspecified trimester: Secondary | ICD-10-CM

## 2012-02-01 DIAGNOSIS — O358XX0 Maternal care for other (suspected) fetal abnormality and damage, not applicable or unspecified: Secondary | ICD-10-CM | POA: Insufficient documentation

## 2012-02-01 DIAGNOSIS — O352XX0 Maternal care for (suspected) hereditary disease in fetus, not applicable or unspecified: Secondary | ICD-10-CM

## 2012-02-01 DIAGNOSIS — O09519 Supervision of elderly primigravida, unspecified trimester: Secondary | ICD-10-CM

## 2012-02-01 DIAGNOSIS — O09529 Supervision of elderly multigravida, unspecified trimester: Secondary | ICD-10-CM | POA: Insufficient documentation

## 2012-02-01 DIAGNOSIS — Z3689 Encounter for other specified antenatal screening: Secondary | ICD-10-CM

## 2012-02-01 DIAGNOSIS — Z363 Encounter for antenatal screening for malformations: Secondary | ICD-10-CM | POA: Insufficient documentation

## 2012-02-13 NOTE — L&D Delivery Note (Signed)
Operative Delivery Note At 3:55 AM a healthy female was delivered via Vaginal, Vacuum Investment banker, operational).  Presentation: vertex; Position: Left,, Occiput,, Anterior; Station: +3.  Verbal consent: obtained from patient.  Risks and benefits discussed in detail.  Risks include, but are not limited to the risks of anesthesia, bleeding, infection, damage to maternal tissues, fetal cephalhematoma.  There is also the risk of inability to effect vaginal delivery of the head, or shoulder dystocia that cannot be resolved by established maneuvers, leading to the need for emergency cesarean section. Easy application of Bell Vacuum.  2 easy pulls in safety zone.  Vacuum assisted vaginal delivery.  APGAR: 9, 9; weight pending.   Placenta status: Intact, Spontaneous.   Cord: 3 vessels with the following complications: None.  Cord pH: none  Anesthesia: Epidural  Instruments: Bell vacuum Episiotomy: None Lacerations: 2nd degree;Perineal and left ant. Labia minora. Suture Repair: vicryl rapide Est. Blood Loss (mL): 300  Mom to postpartum.  Baby to nursery-stable.  Enzio Buchler,MARIE-LYNE 07/15/2012, 4:42 AM

## 2012-02-15 ENCOUNTER — Ambulatory Visit (HOSPITAL_COMMUNITY): Payer: BC Managed Care – PPO

## 2012-02-18 ENCOUNTER — Ambulatory Visit (HOSPITAL_COMMUNITY)
Admission: RE | Admit: 2012-02-18 | Discharge: 2012-02-18 | Disposition: A | Discharge status: Home / Self Care | Payer: BC Managed Care – PPO | Source: Ambulatory Visit | Attending: Obstetrics & Gynecology | Admitting: Obstetrics & Gynecology

## 2012-02-18 VITALS — BP 106/63 | HR 86 | Wt 130.0 lb

## 2012-02-18 DIAGNOSIS — O09529 Supervision of elderly multigravida, unspecified trimester: Secondary | ICD-10-CM

## 2012-02-18 DIAGNOSIS — O09519 Supervision of elderly primigravida, unspecified trimester: Secondary | ICD-10-CM

## 2012-02-18 DIAGNOSIS — O4450 Low lying placenta with hemorrhage, unspecified trimester: Secondary | ICD-10-CM

## 2012-02-18 DIAGNOSIS — O352XX0 Maternal care for (suspected) hereditary disease in fetus, not applicable or unspecified: Secondary | ICD-10-CM

## 2012-02-18 DIAGNOSIS — Z3689 Encounter for other specified antenatal screening: Secondary | ICD-10-CM | POA: Insufficient documentation

## 2012-02-18 NOTE — Progress Notes (Signed)
[facility] Ultrasound  Indication: 42 yr old 19P0020 at [redacted]w[redacted]d for fetal anatomic survey. Previous fetus with mosaic trisomy 97.  Findings: 1. Single intrauterine pregnancy. 2. Fetal biometry is consistent with dating. 3. Posterior placenta that is low lying. The placental edge is 1.7cm from the internal cervical os. 4. Normal amniotic fluid volume. 5. Normal transabdominal cervical length. 6. Normal fetal anatomic survey. Any anatomy not evaluated on today's exam was evaluated on the previous exam. No soft markers of aneuploidy are seen.  Recommendations: 1. Appropriate fetal growth. 2. Fetal anatomic survey is complete. 3. Advanced maternal age: - previously counseled - patient had normal Materni T21; declined amniocentesis  With maternal age over 39 there is increased risk of gestational diabetes, fetal growth restriction, need for Cesarean delivery, and stillbirth. Recommend serial ultrasounds every 4-6 weeks for fetal growth starting at [redacted] weeks gestation. Recommend starting fetal kick counts at [redacted] weeks gestation. Recommend antenatal testing with either weekly biophysical profiles or twice weekly nonstress tests and weekly amniotic fluid index starting at 36 weeks Recommend delivery by estimated due date 4. Low lying placenta: - previously counseled - reviewed bleeding precautions - recommend reevaluate placental location on follow up ultrasound 5. Previous fetus with mosaic trisomy 21: - preivously counseled - had normal MaterniT21; declined amniocentesis  Eulis Foster, MD

## 2012-04-01 ENCOUNTER — Encounter (HOSPITAL_COMMUNITY): Payer: Self-pay

## 2012-04-01 ENCOUNTER — Ambulatory Visit (HOSPITAL_COMMUNITY)
Admission: RE | Admit: 2012-04-01 | Discharge: 2012-04-01 | Disposition: A | Discharge status: Home / Self Care | Payer: BC Managed Care – PPO | Source: Ambulatory Visit | Attending: Obstetrics & Gynecology | Admitting: Obstetrics & Gynecology

## 2012-04-01 DIAGNOSIS — O09529 Supervision of elderly multigravida, unspecified trimester: Secondary | ICD-10-CM

## 2012-04-01 DIAGNOSIS — O4450 Low lying placenta with hemorrhage, unspecified trimester: Secondary | ICD-10-CM

## 2012-04-01 DIAGNOSIS — O352XX0 Maternal care for (suspected) hereditary disease in fetus, not applicable or unspecified: Secondary | ICD-10-CM | POA: Insufficient documentation

## 2012-04-01 DIAGNOSIS — Z3689 Encounter for other specified antenatal screening: Secondary | ICD-10-CM | POA: Insufficient documentation

## 2012-07-08 ENCOUNTER — Telehealth (HOSPITAL_COMMUNITY): Payer: Self-pay | Admitting: *Deleted

## 2012-07-08 ENCOUNTER — Encounter (HOSPITAL_COMMUNITY): Payer: Self-pay | Admitting: *Deleted

## 2012-07-08 NOTE — Telephone Encounter (Signed)
Preadmission screen  

## 2012-07-11 ENCOUNTER — Other Ambulatory Visit: Payer: Self-pay | Admitting: Obstetrics & Gynecology

## 2012-07-13 ENCOUNTER — Encounter (HOSPITAL_COMMUNITY): Payer: Self-pay | Admitting: *Deleted

## 2012-07-13 ENCOUNTER — Inpatient Hospital Stay (HOSPITAL_COMMUNITY)
Admission: AD | Admit: 2012-07-13 | Discharge: 2012-07-17 | DRG: 373 | Disposition: A | Discharge status: Home / Self Care | Payer: BC Managed Care – PPO | Source: Ambulatory Visit | Attending: Obstetrics | Admitting: Obstetrics

## 2012-07-13 DIAGNOSIS — E039 Hypothyroidism, unspecified: Secondary | ICD-10-CM | POA: Diagnosis present

## 2012-07-13 DIAGNOSIS — O99284 Endocrine, nutritional and metabolic diseases complicating childbirth: Secondary | ICD-10-CM | POA: Diagnosis present

## 2012-07-13 DIAGNOSIS — D62 Acute posthemorrhagic anemia: Secondary | ICD-10-CM | POA: Diagnosis not present

## 2012-07-13 DIAGNOSIS — E079 Disorder of thyroid, unspecified: Secondary | ICD-10-CM | POA: Diagnosis present

## 2012-07-13 DIAGNOSIS — O9903 Anemia complicating the puerperium: Secondary | ICD-10-CM | POA: Diagnosis not present

## 2012-07-13 DIAGNOSIS — O09529 Supervision of elderly multigravida, unspecified trimester: Secondary | ICD-10-CM | POA: Diagnosis present

## 2012-07-13 LAB — TYPE AND SCREEN: ABO/RH(D): AB POS

## 2012-07-13 LAB — CBC
Platelets: 224 10*3/uL (ref 150–400)
RBC: 3.74 MIL/uL — ABNORMAL LOW (ref 3.87–5.11)
RDW: 13.1 % (ref 11.5–15.5)
WBC: 9.6 10*3/uL (ref 4.0–10.5)

## 2012-07-13 MED ORDER — OXYTOCIN 40 UNITS IN LACTATED RINGERS INFUSION - SIMPLE MED
62.5000 mL/h | INTRAVENOUS | Status: DC
  Administered 2012-07-15: 62.5 mL/h via INTRAVENOUS

## 2012-07-13 MED ORDER — LIDOCAINE HCL (PF) 1 % IJ SOLN
30.0000 mL | INTRAMUSCULAR | Status: AC | PRN
  Administered 2012-07-15: 30 mL via SUBCUTANEOUS
  Filled 2012-07-13 (×2): qty 30

## 2012-07-13 MED ORDER — LACTATED RINGERS IV SOLN
INTRAVENOUS | Status: DC
  Administered 2012-07-13 – 2012-07-14 (×3): via INTRAVENOUS

## 2012-07-13 MED ORDER — OXYTOCIN 40 UNITS IN LACTATED RINGERS INFUSION - SIMPLE MED
1.0000 m[IU]/min | INTRAVENOUS | Status: DC
  Administered 2012-07-14: 2 m[IU]/min via INTRAVENOUS
  Filled 2012-07-13: qty 1000

## 2012-07-13 MED ORDER — OXYTOCIN BOLUS FROM INFUSION: 500.0000 mL | INTRAVENOUS | Status: DC

## 2012-07-13 MED ORDER — LACTATED RINGERS IV SOLN: 500.0000 mL | INTRAVENOUS | Status: DC | PRN

## 2012-07-13 MED ORDER — CITRIC ACID-SODIUM CITRATE 334-500 MG/5ML PO SOLN: 30.0000 mL | ORAL | Status: DC | PRN

## 2012-07-13 MED ORDER — ACETAMINOPHEN 325 MG PO TABS: 650.0000 mg | ORAL_TABLET | ORAL | Status: DC | PRN

## 2012-07-13 MED ORDER — ZOLPIDEM TARTRATE 5 MG PO TABS
5.0000 mg | ORAL_TABLET | Freq: Every evening | ORAL | Status: DC | PRN
  Administered 2012-07-13: 5 mg via ORAL
  Filled 2012-07-13: qty 1

## 2012-07-13 MED ORDER — PENICILLIN G POTASSIUM 5000000 UNITS IJ SOLR
2.5000 10*6.[IU] | INTRAMUSCULAR | Status: DC
  Administered 2012-07-14 – 2012-07-15 (×4): 2.5 10*6.[IU] via INTRAVENOUS
  Filled 2012-07-13 (×8): qty 2.5

## 2012-07-13 MED ORDER — OXYCODONE-ACETAMINOPHEN 5-325 MG PO TABS: 1.0000 | ORAL_TABLET | ORAL | Status: DC | PRN

## 2012-07-13 MED ORDER — ONDANSETRON HCL 4 MG/2ML IJ SOLN: 4.0000 mg | Freq: Four times a day (QID) | INTRAMUSCULAR | Status: DC | PRN

## 2012-07-13 MED ORDER — TERBUTALINE SULFATE 1 MG/ML IJ SOLN: 0.2500 mg | Freq: Once | INTRAMUSCULAR | Status: AC | PRN

## 2012-07-13 MED ORDER — PENICILLIN G POTASSIUM 5000000 UNITS IJ SOLR
5.0000 10*6.[IU] | Freq: Once | INTRAVENOUS | Status: AC
  Administered 2012-07-14: 5 10*6.[IU] via INTRAVENOUS
  Filled 2012-07-13: qty 5

## 2012-07-13 MED ORDER — IBUPROFEN 600 MG PO TABS
600.0000 mg | ORAL_TABLET | Freq: Four times a day (QID) | ORAL | Status: DC | PRN
  Filled 2012-07-13: qty 1

## 2012-07-13 MED ORDER — MISOPROSTOL 25 MCG QUARTER TABLET
25.0000 ug | ORAL_TABLET | ORAL | Status: DC | PRN
  Administered 2012-07-13 – 2012-07-14 (×2): 25 ug via VAGINAL
  Filled 2012-07-13 (×2): qty 0.25
  Filled 2012-07-13: qty 1

## 2012-07-14 ENCOUNTER — Encounter (HOSPITAL_COMMUNITY): Payer: Self-pay | Admitting: Anesthesiology

## 2012-07-14 ENCOUNTER — Inpatient Hospital Stay (HOSPITAL_COMMUNITY): Admission: RE | Admit: 2012-07-14 | Payer: BC Managed Care – PPO | Source: Ambulatory Visit

## 2012-07-14 ENCOUNTER — Inpatient Hospital Stay (HOSPITAL_COMMUNITY): Payer: BC Managed Care – PPO | Admitting: Anesthesiology

## 2012-07-14 LAB — RPR: RPR Ser Ql: NONREACTIVE

## 2012-07-14 MED ORDER — OXYTOCIN 40 UNITS IN LACTATED RINGERS INFUSION - SIMPLE MED: 1.0000 m[IU]/min | INTRAVENOUS | Status: DC

## 2012-07-14 MED ORDER — PHENYLEPHRINE 40 MCG/ML (10ML) SYRINGE FOR IV PUSH (FOR BLOOD PRESSURE SUPPORT)
80.0000 ug | PREFILLED_SYRINGE | INTRAVENOUS | Status: DC | PRN
  Filled 2012-07-14: qty 2
  Filled 2012-07-14: qty 5

## 2012-07-14 MED ORDER — FENTANYL 2.5 MCG/ML BUPIVACAINE 1/10 % EPIDURAL INFUSION (WH - ANES)
14.0000 mL/h | INTRAMUSCULAR | Status: DC | PRN
  Administered 2012-07-14 (×2): 14 mL/h via EPIDURAL
  Filled 2012-07-14 (×2): qty 125

## 2012-07-14 MED ORDER — LACTATED RINGERS IV SOLN
500.0000 mL | Freq: Once | INTRAVENOUS | Status: AC
  Administered 2012-07-14: 500 mL via INTRAVENOUS

## 2012-07-14 MED ORDER — LIDOCAINE HCL (PF) 1 % IJ SOLN
INTRAMUSCULAR | Status: DC | PRN
  Administered 2012-07-14 (×4): 4 mL

## 2012-07-14 MED ORDER — DIPHENHYDRAMINE HCL 50 MG/ML IJ SOLN
12.5000 mg | INTRAMUSCULAR | Status: DC | PRN
  Administered 2012-07-14: 12.5 mg via INTRAVENOUS
  Filled 2012-07-14 (×2): qty 1

## 2012-07-14 MED ORDER — PHENYLEPHRINE 40 MCG/ML (10ML) SYRINGE FOR IV PUSH (FOR BLOOD PRESSURE SUPPORT)
80.0000 ug | PREFILLED_SYRINGE | INTRAVENOUS | Status: DC | PRN
  Filled 2012-07-14: qty 2

## 2012-07-14 MED ORDER — EPHEDRINE 5 MG/ML INJ
10.0000 mg | INTRAVENOUS | Status: DC | PRN
  Filled 2012-07-14: qty 4
  Filled 2012-07-14: qty 2

## 2012-07-14 MED ORDER — EPHEDRINE 5 MG/ML INJ
10.0000 mg | INTRAVENOUS | Status: DC | PRN
  Administered 2012-07-14: 10 mg via INTRAVENOUS
  Filled 2012-07-14: qty 2

## 2012-07-14 NOTE — Anesthesia Preprocedure Evaluation (Signed)
Anesthesia Evaluation  Patient identified by MRN, date of birth, ID band Patient awake    Reviewed: Allergy & Precautions, H&P , NPO status , Patient's Chart, lab work & pertinent test results, reviewed documented beta blocker date and time   History of Anesthesia Complications (+) PONV  Airway Mallampati: I TM Distance: >3 FB Neck ROM: full    Dental  (+) Teeth Intact   Pulmonary neg pulmonary ROS,  breath sounds clear to auscultation        Cardiovascular negative cardio ROS  Rhythm:regular Rate:Normal     Neuro/Psych  Headaches (migraines - less with pregnancy),  Neuromuscular disease (h/o bells palsy two months ago, resolved) negative psych ROS   GI/Hepatic negative GI ROS, Neg liver ROS,   Endo/Other  Hypothyroidism   Renal/GU negative Renal ROS  negative genitourinary   Musculoskeletal   Abdominal   Peds  Hematology negative hematology ROS (+)   Anesthesia Other Findings   Reproductive/Obstetrics (+) Pregnancy                           Anesthesia Physical Anesthesia Plan  ASA: II  Anesthesia Plan: Epidural   Post-op Pain Management:    Induction:   Airway Management Planned:   Additional Equipment:   Intra-op Plan:   Post-operative Plan:   Informed Consent: I have reviewed the patients History and Physical, chart, labs and discussed the procedure including the risks, benefits and alternatives for the proposed anesthesia with the patient or authorized representative who has indicated his/her understanding and acceptance.     Plan Discussed with:   Anesthesia Plan Comments:         Anesthesia Quick Evaluation

## 2012-07-14 NOTE — H&P (Signed)
Subjective:  Tiffany Webb is a 42 y.o. G3 P0 female with [facility] 07/19/2012 at 47 and 2/[redacted] weeks gestation who is being admitted for induction of labor.  Her current obstetrical history is significant for advanced maternal age.  Patient reports no complaints.   Fetal Movement: normal.     Objective:   Vital signs in last 24 hours: Temp:  [98.1 F (36.7 C)-98.5 F (36.9 C)] 98.1 F (36.7 C) (06/02 0801) Pulse Rate:  [59-90] 75 (06/02 0801) Resp:  [18] 18 (06/02 0801) BP: (82-117)/(33-78) 101/78 mmHg (06/02 0801) Weight:  [70.308 kg (155 lb)] 70.308 kg (155 lb) (06/01 2028)   General:   alert  Skin:   normal  HEENT:  PERRLA  Lungs:   clear to auscultation bilaterally  Heart:   regular rate and rhythm  Breasts:   Deferred  Abdomen:  Gravid  Pelvis:  Exam deferred.  FHT:  140's BPM, accelerations present, no deceleration.  Uterine Size: size equals dates  Presentations: cephalic  Cervix:    Dilation: 1cm   Effacement: 50%   Station:  -2   Consistency: soft   Position: middle   Lab Review  AB, Rh+  AFP:NML, Maternity 21 wnl  One hour GTT: Normal   GBS pos.   Assessment/Plan:  39 and 2/[redacted] weeks gestation.  F-Well-being reassuring. Not in labor.  Induction Cytotec.  Now starting Pitocin.  Pen G for GBS pos. Obstetrical history significant for advanced maternal age, 42 yo.  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  Hypothyroidism on Rx.     Risks, benefits, alternatives and possible complications have been discussed in detail with the patient.  Pre-admission, admission, and post admission procedures and expectations were discussed in detail.  All questions answered, all appropriate consents will be signed at the Hospital. Admission is planned for today.  Continue present management.   Epidural PRN.  [name] 07/14/2012

## 2012-07-14 NOTE — Anesthesia Procedure Notes (Signed)
Epidural Patient location during procedure: OB Start time: 07/14/2012 2:13 PM  Staffing Performed by: anesthesiologist   Preanesthetic Checklist Completed: patient identified, site marked, surgical consent, pre-op evaluation, timeout performed, IV checked, risks and benefits discussed and monitors and equipment checked  Epidural Patient position: sitting Prep: site prepped and draped and DuraPrep Patient monitoring: continuous pulse ox and blood pressure Approach: midline Injection technique: LOR air  Needle:  Needle type: Tuohy  Needle gauge: 17 G Needle length: 9 cm and 9 Needle insertion depth: 4.5 cm Catheter type: closed end flexible Catheter size: 19 Gauge Catheter at skin depth: 9.5 cm Test dose: negative  Assessment Events: blood not aspirated, injection not painful, no injection resistance, negative IV test and no paresthesia  Additional Notes Discussed risk of headache, infection, bleeding, nerve injury and failed or incomplete block.  Patient voices understanding and wishes to proceed.  Epidural placed easily on first attempt.  No paresthesia.  Patient tolerated procedure well with no apparent complications.  Jasmine December, MDReason for block:procedure for pain

## 2012-07-14 NOTE — Progress Notes (Signed)
Subjective: Doing well, pain controled under epidural, UCs q2-3 min  Anesthesia epidural   Objective: BP 108/67  Pulse 78  Temp(Src) 98.5 F (36.9 C) (Oral)  Resp 18  Ht 5\' 4"  (1.626 m)  Wt 70.308 kg (155 lb)  BMI 26.59 kg/m2  SpO2 100%  LMP 10/20/2011   FHT:  FHR: 140 bpm, variability: moderate,  accelerations:  Present,  decelerations:  Absent UC:   regular, every 2-3 minutes VE:   Dilation: 3 Effacement (%): 80 Station: -1 Exam by:: dr. Seymour Bars   Assessment / Plan: Induction of labor due to [facility] medical conditions,  progressing well on pitocin.  AMA 42 yo.  Fetal Wellbeing:  Category I Pain Control:  Epidural  Anticipated MOD:  NSVD  Tiffany Webb,Tiffany Webb 07/14/2012, 6:19 PM

## 2012-07-14 NOTE — Progress Notes (Signed)
Subjective: Doing well, pain mild, UCs q3 min  Anesthesia none   Objective: BP 107/67  Pulse 75  Temp(Src) 97.5 F (36.4 C) (Oral)  Resp 18  Ht 5\' 4"  (1.626 m)  Wt 70.308 kg (155 lb)  BMI 26.59 kg/m2  LMP 10/20/2011   FHT:  FHR: 140 bpm, variability: moderate,  accelerations:  Present,  decelerations:  Absent UC:   regular, every 3 minutes VE:   Dilation: 2 Effacement (%): 70 Station: -2 Exam by:: dr. Seymour Bars   Assessment / Plan: 39+wks induction for AMA 42 yo.  Entering labor on Pitocin. Fetal Wellbeing:  Category I Pain Control:  Labor support without medications, may have epidural in active labor.  Anticipated MOD:  NSVD  Tiffany Webb,Tiffany Webb 07/14/2012, 1:05 PM

## 2012-07-15 ENCOUNTER — Encounter (HOSPITAL_COMMUNITY): Payer: Self-pay | Admitting: *Deleted

## 2012-07-15 MED ORDER — WITCH HAZEL-GLYCERIN EX PADS: 1.0000 "application " | MEDICATED_PAD | CUTANEOUS | Status: DC | PRN

## 2012-07-15 MED ORDER — DIPHENHYDRAMINE HCL 25 MG PO CAPS: 25.0000 mg | ORAL_CAPSULE | Freq: Four times a day (QID) | ORAL | Status: DC | PRN

## 2012-07-15 MED ORDER — LANOLIN HYDROUS EX OINT: TOPICAL_OINTMENT | CUTANEOUS | Status: DC | PRN

## 2012-07-15 MED ORDER — TETANUS-DIPHTH-ACELL PERTUSSIS 5-2.5-18.5 LF-MCG/0.5 IM SUSP: 0.5000 mL | Freq: Once | INTRAMUSCULAR | Status: DC

## 2012-07-15 MED ORDER — ZOLPIDEM TARTRATE 5 MG PO TABS: 5.0000 mg | ORAL_TABLET | Freq: Every evening | ORAL | Status: DC | PRN

## 2012-07-15 MED ORDER — OXYTOCIN 40 UNITS IN LACTATED RINGERS INFUSION - SIMPLE MED: 62.5000 mL/h | INTRAVENOUS | Status: DC | PRN

## 2012-07-15 MED ORDER — OXYCODONE-ACETAMINOPHEN 5-325 MG PO TABS
1.0000 | ORAL_TABLET | ORAL | Status: DC | PRN
  Administered 2012-07-15 – 2012-07-17 (×7): 1 via ORAL
  Filled 2012-07-15 (×7): qty 1

## 2012-07-15 MED ORDER — PRENATAL MULTIVITAMIN CH
1.0000 | ORAL_TABLET | Freq: Every day | ORAL | Status: DC
  Administered 2012-07-15 – 2012-07-16 (×2): 1 via ORAL
  Filled 2012-07-15 (×2): qty 1

## 2012-07-15 MED ORDER — DIBUCAINE 1 % RE OINT: 1.0000 "application " | TOPICAL_OINTMENT | RECTAL | Status: DC | PRN

## 2012-07-15 MED ORDER — SENNOSIDES-DOCUSATE SODIUM 8.6-50 MG PO TABS
2.0000 | ORAL_TABLET | Freq: Every day | ORAL | Status: DC
  Administered 2012-07-15 – 2012-07-16 (×2): 2 via ORAL

## 2012-07-15 MED ORDER — IBUPROFEN 600 MG PO TABS
600.0000 mg | ORAL_TABLET | Freq: Four times a day (QID) | ORAL | Status: DC
  Administered 2012-07-15 – 2012-07-17 (×9): 600 mg via ORAL
  Filled 2012-07-15 (×8): qty 1

## 2012-07-15 MED ORDER — BENZOCAINE-MENTHOL 20-0.5 % EX AERO
1.0000 "application " | INHALATION_SPRAY | CUTANEOUS | Status: DC | PRN
  Administered 2012-07-15 – 2012-07-16 (×2): 1 via TOPICAL
  Filled 2012-07-15 (×2): qty 56

## 2012-07-15 MED ORDER — ONDANSETRON HCL 4 MG/2ML IJ SOLN: 4.0000 mg | INTRAMUSCULAR | Status: DC | PRN

## 2012-07-15 MED ORDER — SIMETHICONE 80 MG PO CHEW: 80.0000 mg | CHEWABLE_TABLET | ORAL | Status: DC | PRN

## 2012-07-15 MED ORDER — ONDANSETRON HCL 4 MG PO TABS: 4.0000 mg | ORAL_TABLET | ORAL | Status: DC | PRN

## 2012-07-15 NOTE — Anesthesia Postprocedure Evaluation (Signed)
  Anesthesia Post-op Note  Patient: Tiffany Webb  Procedure(s) Performed: * No procedures listed *  Patient Location: Mother/Baby  Anesthesia Type:Epidural  Level of Consciousness: awake  Airway and Oxygen Therapy: Patient Spontanous Breathing  Post-op Pain: none  Post-op Assessment: Patient's Cardiovascular Status Stable, Respiratory Function Stable, Patent Airway, No signs of Nausea or vomiting, Adequate PO intake, Pain level controlled, No headache, No backache, No residual numbness and No residual motor weakness  Post-op Vital Signs: Reviewed and stable  Complications: No apparent anesthesia complications

## 2012-07-16 LAB — CBC
MCHC: 33.8 g/dL (ref 30.0–36.0)
MCV: 92.8 fL (ref 78.0–100.0)
Platelets: 177 10*3/uL (ref 150–400)
RDW: 13.2 % (ref 11.5–15.5)
WBC: 11.9 10*3/uL — ABNORMAL HIGH (ref 4.0–10.5)

## 2012-07-16 MED ORDER — POLYSACCHARIDE IRON COMPLEX 150 MG PO CAPS
150.0000 mg | ORAL_CAPSULE | Freq: Every day | ORAL | Status: DC
  Administered 2012-07-16 – 2012-07-17 (×2): 150 mg via ORAL
  Filled 2012-07-16 (×2): qty 1

## 2012-07-16 MED ORDER — DOCUSATE SODIUM 100 MG PO CAPS
100.0000 mg | ORAL_CAPSULE | Freq: Every day | ORAL | Status: DC
  Administered 2012-07-16 – 2012-07-17 (×2): 100 mg via ORAL
  Filled 2012-07-16 (×2): qty 1

## 2012-07-16 NOTE — Progress Notes (Signed)
PPD 1 VAVD - 2nd degree LAC  S:  Reports feeling well             Tolerating po/ No nausea or vomiting             Bleeding is moderate             Pain controlled with motrin and percocet             Up ad lib / ambulatory / voiding QS  Newborn breast feeding  / Circumcision planned O:               VS: BP 113/73  Pulse 75  Temp(Src) 97.9 F (36.6 C) (Oral)  Resp 17  Ht 5\' 4"  (1.626 m)  Wt 70.308 kg (155 lb)  BMI 26.59 kg/m2  SpO2 100%  LMP 10/20/2011   LABS:  Recent Labs  07/13/12 2040 07/16/12 0610  WBC 9.6 11.9*  HGB 11.8* 9.2*  PLT 224 177                Physical Exam:             Alert and oriented X3  Lungs: Clear and unlabored  Heart: regular rate and rhythm / no mumurs  Abdomen: soft, non-tender, non-distended              Fundus: firm, non-tender, Ueven  Perineum: moderate edema - ice pack inplace  Lochia: light  Extremities: no edema, no calf pain or tenderness    A: PPD # 1 VAVD              Mild ABL anemia  Doing well - stable status  P:  Routine post partum orders  Anticipate DC tomorrow  Marlinda Mike CNM, MSN, [facility] 07/16/2012, 8:22 AM

## 2012-07-17 MED ORDER — IBUPROFEN 600 MG PO TABS: 600.0000 mg | ORAL_TABLET | Freq: Four times a day (QID) | ORAL | Status: DC

## 2012-07-17 MED ORDER — OXYCODONE-ACETAMINOPHEN 5-325 MG PO TABS: 1.0000 | ORAL_TABLET | ORAL | Status: DC | PRN

## 2012-07-17 MED ORDER — DSS 100 MG PO CAPS: 100.0000 mg | ORAL_CAPSULE | Freq: Every day | ORAL | Status: DC

## 2012-07-17 MED ORDER — POLYSACCHARIDE IRON COMPLEX 150 MG PO CAPS: 150.0000 mg | ORAL_CAPSULE | Freq: Every day | ORAL | Status: DC

## 2012-07-17 NOTE — Discharge Summary (Signed)
Obstetric Discharge Summary  Reason for Admission: induction of labor - AMA / hypothyroidism Prenatal Procedures: NST and ultrasound Intrapartum Procedures: spontaneous vaginal delivery and GBS prophylaxis Postpartum Procedures: none Complications-Operative and Postpartum: 2nd degree perineal laceration after VAVD / ABL anemia Hemoglobin  Date Value Range Status  07/16/2012 9.2* 12.0 - 15.0 g/dL Final     HCT  Date Value Range Status  07/16/2012 27.2* 36.0 - 46.0 % Final    Physical Exam:  General: alert, cooperative, fatigued and no distress Lochia: appropriate Uterine Fundus: firm Incision: healing well DVT Evaluation: No evidence of DVT seen on physical exam.  Discharge Diagnoses: Term Pregnancy-delivered and ABL anemia - stable  Discharge Information: Date: 07/17/2012 Activity: pelvic rest Diet: routine Medications: PNV, Ibuprofen, Colace, Iron, Percocet and Levothyroxine and vitamin D with calcium Condition: stable Instructions: refer to practice specific booklet Discharge to: home Follow-up Information   Follow up with LAVOIE,MARIE-LYNE, MD. Schedule an appointment as soon as possible for a visit in 6 weeks.   Contact information:   [address] [place] [address] [phone]       Newborn Data: Live born female  Birth Weight: 5 lb 13 oz (2637 g) APGAR: 9, 9  Home with mother.  Marlinda Mike 07/17/2012, 7:01 AM

## 2012-07-17 NOTE — Progress Notes (Signed)
PPD 2 VAVD with 2nd degree LAC  S:  Reports feeling tired - clustering all night             Tolerating po/ No nausea or vomiting             Bleeding is light             Pain controlled with motrin and percocet             Up ad lib / ambulatory / voiding QS  Newborn breast feeding   O:               VS: BP 102/47  Pulse 65  Temp(Src) 98 F (36.7 C) (Oral)  Resp 17  Ht 5\' 4"  (1.626 m)  Wt 70.308 kg (155 lb)  BMI 26.59 kg/m2  SpO2 96%  LMP 10/20/2011   LABS:  Recent Labs  07/16/12 0610  WBC 11.9*  HGB 9.2*  PLT 177                       Physical Exam:             Alert and oriented X3  Abdomen: soft, non-tender, non-distended              Fundus: firm, non-tender, U-1  Perineum: mild edema  Lochia: light  Extremities: no edema, no calf pain or tenderness    A: PPD # 2 VAVD             Mild ABL anemia - stable  Doing well - stable status  P: Routine post partum orders  DC home             WOB booklet - instructions reviewed  Marlinda Mike CNM, MSN, [facility] 07/17/2012, 6:57 AM

## 2013-12-01 ENCOUNTER — Emergency Department (HOSPITAL_COMMUNITY)
Admission: EM | Admit: 2013-12-01 | Discharge: 2013-12-01 | Disposition: A | Discharge status: Home / Self Care | Payer: BC Managed Care – PPO | Attending: Emergency Medicine | Admitting: Emergency Medicine

## 2013-12-01 ENCOUNTER — Encounter (HOSPITAL_COMMUNITY): Payer: Self-pay | Admitting: Emergency Medicine

## 2013-12-01 DIAGNOSIS — Z8669 Personal history of other diseases of the nervous system and sense organs: Secondary | ICD-10-CM | POA: Insufficient documentation

## 2013-12-01 DIAGNOSIS — Y9389 Activity, other specified: Secondary | ICD-10-CM | POA: Diagnosis not present

## 2013-12-01 DIAGNOSIS — G43909 Migraine, unspecified, not intractable, without status migrainosus: Secondary | ICD-10-CM | POA: Diagnosis not present

## 2013-12-01 DIAGNOSIS — Y9289 Other specified places as the place of occurrence of the external cause: Secondary | ICD-10-CM | POA: Diagnosis not present

## 2013-12-01 DIAGNOSIS — E039 Hypothyroidism, unspecified: Secondary | ICD-10-CM | POA: Insufficient documentation

## 2013-12-01 DIAGNOSIS — W260XXA Contact with knife, initial encounter: Secondary | ICD-10-CM | POA: Insufficient documentation

## 2013-12-01 DIAGNOSIS — Z79899 Other long term (current) drug therapy: Secondary | ICD-10-CM | POA: Diagnosis not present

## 2013-12-01 DIAGNOSIS — IMO0002 Reserved for concepts with insufficient information to code with codable children: Secondary | ICD-10-CM

## 2013-12-01 DIAGNOSIS — S61213A Laceration without foreign body of left middle finger without damage to nail, initial encounter: Secondary | ICD-10-CM | POA: Insufficient documentation

## 2013-12-01 MED ORDER — HYDROCODONE-ACETAMINOPHEN 5-325 MG PO TABS: 1.0000 | ORAL_TABLET | Freq: Four times a day (QID) | ORAL | Status: DC | PRN

## 2013-12-01 NOTE — Discharge Instructions (Signed)
Keep area covered with a bandage, keep bandage dry, and do not submerge in water for 24 hours. Ice and elevate for additional pain relief and swelling. Alternate between Ibuprofen and Tylenol or norco for additional pain relief, but don't drive or operate machinery with norco use. Follow up with your primary care doctor or the [facility] in 2 days for wound recheck. Monitor area for signs of infection to include, but not limited to: increasing pain, redness, drainage/pus, or swelling. Return to emergency department for emergent changing or worsening symptoms.   Fingertip Injuries and Amputations Fingertip injuries are common and often get injured because they are last to escape when pulling your hand out of harm's way. You have amputated (cut off) part of your finger. How this turns out depends largely on how much was amputated. If just the tip is amputated, often the end of the finger will grow back and the finger may return to much the same as it was before the injury.  If more of the finger is missing, your caregiver has done the best with the tissue remaining to allow you to keep as much finger as is possible. Your caregiver after checking your injury has tried to leave you with a painless fingertip that has durable, feeling skin. If possible, your caregiver has tried to maintain the finger's length and appearance and preserve its fingernail.  Please read the instructions outlined below and refer to this sheet in the next few weeks. These instructions provide you with general information on caring for yourself. Your caregiver may also give you specific instructions. While your treatment has been done according to the most current medical practices available, unavoidable complications occasionally occur. If you have any problems or questions after discharge, please call your caregiver. HOME CARE INSTRUCTIONS   You may resume normal diet and activities as directed or allowed.  Keep  your hand elevated above the level of your heart. This helps decrease pain and swelling.  Keep ice packs (or a bag of ice wrapped in a towel) on the injured area for 15-20 minutes, 03-04 times per day, for the first two days.  Change dressings if necessary or as directed.  Clean the wound daily or as directed.  Only take over-the-counter or prescription medicines for pain, discomfort, or fever as directed by your caregiver.  Keep appointments as directed. SEEK IMMEDIATE MEDICAL CARE IF:  You develop redness, swelling, numbness or increasing pain in the wound.  There is pus coming from the wound.  You develop an unexplained oral temperature above 102 F (38.9 C) or as your caregiver suggests.  There is a foul (bad) smell coming from the wound or dressing.  There is a breaking open of the wound (edges not staying together) after sutures or staples have been removed. MAKE SURE YOU:   Understand these instructions.  Will watch your condition.  Will get help right away if you are not doing well or get worse. Document Released: 12/20/2004 Document Revised: 04/23/2011 Document Reviewed: 11/19/2007 [facility] Patient Information 2015 [place]. This information is not intended to replace advice given to you by your health care provider. Make sure you discuss any questions you have with your health care provider.  Laceration Care, Adult A laceration is a cut or lesion that goes through all layers of the skin and into the tissue just beneath the skin. TREATMENT  Some lacerations may not require closure. Some lacerations may not be able to be closed due to  an increased risk of infection. It is important to see your caregiver as soon as possible after an injury to minimize the risk of infection and maximize the opportunity for successful closure. If closure is appropriate, pain medicines may be given, if needed. The wound will be cleaned to help prevent infection. Your caregiver will use  stitches (sutures), staples, wound glue (adhesive), or skin adhesive strips to repair the laceration. These tools bring the skin edges together to allow for faster healing and a better cosmetic outcome. However, all wounds will heal with a scar. Once the wound has healed, scarring can be minimized by covering the wound with sunscreen during the day for 1 full year. HOME CARE INSTRUCTIONS  For sutures or staples:  Keep the wound clean and dry.  If you were given a bandage (dressing), you should change it at least once a day. Also, change the dressing if it becomes wet or dirty, or as directed by your caregiver.  Wash the wound with soap and water 2 times a day. Rinse the wound off with water to remove all soap. Pat the wound dry with a clean towel.  After cleaning, apply a thin layer of the antibiotic ointment as recommended by your caregiver. This will help prevent infection and keep the dressing from sticking.  You may shower as usual after the first 24 hours. Do not soak the wound in water until the sutures are removed.  Only take over-the-counter or prescription medicines for pain, discomfort, or fever as directed by your caregiver.  Get your sutures or staples removed as directed by your caregiver. For skin adhesive strips:  Keep the wound clean and dry.  Do not get the skin adhesive strips wet. You may bathe carefully, using caution to keep the wound dry.  If the wound gets wet, pat it dry with a clean towel.  Skin adhesive strips will fall off on their own. You may trim the strips as the wound heals. Do not remove skin adhesive strips that are still stuck to the wound. They will fall off in time. For wound adhesive:  You may briefly wet your wound in the shower or bath. Do not soak or scrub the wound. Do not swim. Avoid periods of heavy perspiration until the skin adhesive has fallen off on its own. After showering or bathing, gently pat the wound dry with a clean towel.  Do not  apply liquid medicine, cream medicine, or ointment medicine to your wound while the skin adhesive is in place. This may loosen the film before your wound is healed.  If a dressing is placed over the wound, be careful not to apply tape directly over the skin adhesive. This may cause the adhesive to be pulled off before the wound is healed.  Avoid prolonged exposure to sunlight or tanning lamps while the skin adhesive is in place. Exposure to ultraviolet light in the first year will darken the scar.  The skin adhesive will usually remain in place for 5 to 10 days, then naturally fall off the skin. Do not pick at the adhesive film. You may need a tetanus shot if:  You cannot remember when you had your last tetanus shot.  You have never had a tetanus shot. If you get a tetanus shot, your arm may swell, get red, and feel warm to the touch. This is common and not a problem. If you need a tetanus shot and you choose not to have one, there is a rare chance  of getting tetanus. Sickness from tetanus can be serious. SEEK MEDICAL CARE IF:   You have redness, swelling, or increasing pain in the wound.  You see a red line that goes away from the wound.  You have yellowish-white fluid (pus) coming from the wound.  You have a fever.  You notice a bad smell coming from the wound or dressing.  Your wound breaks open before or after sutures have been removed.  You notice something coming out of the wound such as wood or glass.  Your wound is on your hand or foot and you cannot move a finger or toe. SEEK IMMEDIATE MEDICAL CARE IF:   Your pain is not controlled with prescribed medicine.  You have severe swelling around the wound causing pain and numbness or a change in color in your arm, hand, leg, or foot.  Your wound splits open and starts bleeding.  You have worsening numbness, weakness, or loss of function of any joint around or beyond the wound.  You develop painful lumps near the wound or on  the skin anywhere on your body. MAKE SURE YOU:   Understand these instructions.  Will watch your condition.  Will get help right away if you are not doing well or get worse. Document Released: 01/29/2005 Document Revised: 04/23/2011 Document Reviewed: 07/25/2010 [facility] Patient Information 2015 [place], [place]. This information is not intended to replace advice given to you by your health care provider. Make sure you discuss any questions you have with your health care provider.

## 2013-12-01 NOTE — ED Provider Notes (Signed)
CSN: [id number]     Arrival date & time 12/01/13  1445 History   First MD Initiated Contact with Patient 12/01/13 1509     Chief Complaint  Patient presents with  . Finger laceration      (Consider location/radiation/quality/duration/timing/severity/associated sxs/prior Treatment) HPI Comments: Tiffany Webb is a 43 y.o. female with no significant PMHx, who presents to the ED with complaints of L middle finger tip laceration which occurred while cutting squash one hour ago. Pain is 5/10 throbbing constant nonradiating located at the site of the wound, relieved by pressure, and without aggravating factors. Bleeding oozing but controlled with pressure. Not taking any blood thinners and has no known blood clotting disorders. Tetanus UTD. Denies numbness, weakness, loss of ROM, or joint swelling/myalgias/arthralgias.   Patient is a 43 y.o. female presenting with skin laceration. The history is provided by the patient. No language interpreter was used.  Laceration Location:  Hand Hand laceration location:  L finger Length (cm):  0.5 Depth:  Cutaneous Quality: avulsion   Quality comment:  Finger tip flap Bleeding: controlled with pressure   Time since incident:  1 hour Laceration mechanism:  Knife Pain details:    Quality:  Throbbing   Severity:  Mild (5/10)   Timing:  Constant   Progression:  Waxing and waning Foreign body present:  No foreign bodies Relieved by:  Pressure Worsened by:  Nothing tried Ineffective treatments:  None tried Tetanus status:  Up to date   Past Medical History  Diagnosis Date  . Migraines   . Abnormal Pap smear of cervix   . No pertinent past medical history   . PONV (postoperative nausea and vomiting)   . AMA (advanced maternal age) multigravida 35+   . Hypothyroidism   . Gonadotropin resistant ovary syndrome   . Hx of Bell's palsy     several months ago   Past Surgical History  Procedure Laterality Date  . Gynecologic cryosurgery    . Wisdom  tooth extraction    . Dilation and evacuation  01/08/2011    Procedure: DILATATION AND EVACUATION (D&E) 2ND TRIMESTER;  Surgeon: Genia Del;  Location: [facility];  Service: Gynecology;  Laterality: N/A;   Family History  Problem Relation Age of Onset  . Osteopenia Mother   . Asthma Father   . Graves' disease Sister    History  Substance Use Topics  . Smoking status: Never Smoker   . Smokeless tobacco: Never Used  . Alcohol Use: No   OB History   Grav Para Term Preterm Abortions TAB SAB Ect Mult Living   3 1 1  0 2 1 1  0 0 1     Review of Systems  Musculoskeletal: Negative for arthralgias, joint swelling and myalgias.  Skin: Positive for wound.  Neurological: Negative for weakness and numbness.  Hematological: Does not bruise/bleed easily.   10 Systems reviewed and are negative for acute change except as noted in the HPI.    Allergies  Avocado and Food  Home Medications   Prior to Admission medications   Medication Sig Start Date End Date Taking? Authorizing Provider  Calcium Carbonate (RA CALCIUM 600 PO) Take 1 tablet by mouth daily.    Historical Provider, MD  Cholecalciferol (HM VITAMIN D3) 4000 UNITS CAPS Take 1 capsule by mouth daily.    Historical Provider, MD  docusate sodium 100 MG CAPS Take 100 mg by mouth daily. 07/17/12   Marlinda Mike, CNM  ibuprofen (ADVIL,MOTRIN) 600 MG tablet Take 1 tablet (600 mg  total) by mouth every 6 (six) hours. 07/17/12   Marlinda Mikeanya Bailey, CNM  iron polysaccharides (NIFEREX) 150 MG capsule Take 1 capsule (150 mg total) by mouth daily. 07/17/12   Marlinda Mikeanya Bailey, CNM  levothyroxine (SYNTHROID, LEVOTHROID) 25 MCG tablet Take 25 mcg by mouth daily.    Historical Provider, MD  oxyCODONE-acetaminophen (PERCOCET/ROXICET) 5-325 MG per tablet Take 1 tablet by mouth every 4 (four) hours as needed. 07/17/12   Marlinda Mikeanya Bailey, CNM  Prenatal-Na Feredta-FA-Omega 3 (CAVAN-EC SOD DHA) 30-1 & 440 MG MISC Take 2 tablets by mouth Daily. 1 tablet is PNV; 1 gelcap = DHA  11/24/10   Historical Provider, MD   BP 117/65  Pulse 73  Temp(Src) 97.7 F (36.5 C) (Oral)  Resp 16  SpO2 99%  LMP 11/24/2013 Physical Exam  Nursing note and vitals reviewed. Constitutional: She is oriented to person, place, and time. Vital signs are normal. She appears well-developed and well-nourished. No distress.  NAD  HENT:  Head: Normocephalic and atraumatic.  Mouth/Throat: Mucous membranes are normal.  Eyes: Conjunctivae and EOM are normal. Right eye exhibits no discharge. Left eye exhibits no discharge.  Neck: Normal range of motion. Neck supple.  Cardiovascular: Normal rate and intact distal pulses.   Pulmonary/Chest: Effort normal. No respiratory distress.  Abdominal: Normal appearance. She exhibits no distension.  Musculoskeletal: Normal range of motion.       Left hand: She exhibits laceration. She exhibits normal range of motion, no tenderness, normal two-point discrimination and normal capillary refill. Normal sensation noted. Normal strength noted.  FROM intact in L hand and digits, cap refill brisk and present, no bony TTP. Strength and sensation intact. Small flap missing from fingertip, approx 0.5cm in length and 2mm in width  Neurological: She is alert and oriented to person, place, and time. She has normal strength. No sensory deficit.  Skin: Skin is warm and dry. Laceration noted. No rash noted.  Laceration to L middle finger tip as noted above  Psychiatric: She has a normal mood and affect.    ED Course  Procedures (including critical care time) Labs Review Labs Reviewed - No data to display  Imaging Review No results found.   EKG Interpretation None      MDM   Final diagnoses:  Laceration    43y/o female with small laceration to fingertip, L middle finger. Small flap missing, very superficial, oozing blood but controlled with pressure. Placed wound seal over wound to give scab, placed pressure dressing, and discussed f/up in 2 days for wound  recheck. No need for xray, very superficial and no FBs seen. Pt UTD on tetanus. No need for abx today, pt healthy and low risk for infection, but will have recheck in 2 days to eval need for abx. Declined pain meds here, but wants script for home just in case. Discussed ice and elevation with pressure to control oozing and pain. I explained the diagnosis and have given explicit precautions to return to the ER including for any other new or worsening symptoms. The patient understands and accepts the medical plan as it's been dictated and I have answered their questions. Discharge instructions concerning home care and prescriptions have been given. The patient is STABLE and is discharged to home in good condition.  BP 117/65  Pulse 73  Temp(Src) 97.7 F (36.5 C) (Oral)  Resp 16  SpO2 99%  LMP 11/24/2013  Meds ordered this encounter  Medications  . HYDROcodone-acetaminophen (NORCO) 5-325 MG per tablet    Sig: Take  1-2 tablets by mouth every 6 (six) hours as needed for severe pain.    Dispense:  6 tablet    Refill:  0    Order Specific Question:  Supervising Provider    Answer:  Vida RollerMILLER, BRIAN D [address][id number]]       Rexton Greulich Strupp Camprubi-Soms, PA-C 12/01/13 1545

## 2013-12-01 NOTE — ED Provider Notes (Signed)
Medical screening examination/treatment/procedure(s) were performed by non-physician practitioner and as supervising physician I was immediately available for consultation/collaboration.   EKG Interpretation None        Elham Fini H Lateka Rady, MD 12/01/13 2332 

## 2013-12-01 NOTE — ED Notes (Signed)
Pt c/o laceration on tip of middle digit on L hand.  Pain score 4/10.  Pt reports she was cutting vegetables, when she cut her finger.

## 2013-12-14 ENCOUNTER — Encounter (HOSPITAL_COMMUNITY): Payer: Self-pay | Admitting: Emergency Medicine

## 2013-12-15 DIAGNOSIS — Z8349 Family history of other endocrine, nutritional and metabolic diseases: Secondary | ICD-10-CM | POA: Insufficient documentation

## 2013-12-15 DIAGNOSIS — N96 Recurrent pregnancy loss: Secondary | ICD-10-CM | POA: Insufficient documentation

## 2013-12-15 DIAGNOSIS — R7989 Other specified abnormal findings of blood chemistry: Secondary | ICD-10-CM | POA: Insufficient documentation

## 2013-12-15 DIAGNOSIS — Z9889 Other specified postprocedural states: Secondary | ICD-10-CM | POA: Insufficient documentation

## 2013-12-15 DIAGNOSIS — Z8742 Personal history of other diseases of the female genital tract: Secondary | ICD-10-CM | POA: Insufficient documentation

## 2015-07-12 DIAGNOSIS — F4320 Adjustment disorder, unspecified: Secondary | ICD-10-CM | POA: Diagnosis not present

## 2015-07-20 DIAGNOSIS — F4320 Adjustment disorder, unspecified: Secondary | ICD-10-CM | POA: Diagnosis not present

## 2015-08-04 DIAGNOSIS — F4320 Adjustment disorder, unspecified: Secondary | ICD-10-CM | POA: Diagnosis not present

## 2015-08-11 ENCOUNTER — Ambulatory Visit (INDEPENDENT_AMBULATORY_CARE_PROVIDER_SITE_OTHER): Payer: BLUE CROSS/BLUE SHIELD | Admitting: Neurology

## 2015-08-11 ENCOUNTER — Encounter: Payer: Self-pay | Admitting: Neurology

## 2015-08-11 VITALS — BP 103/72 | HR 87 | Ht 64.0 in | Wt 122.5 lb

## 2015-08-11 DIAGNOSIS — N943 Premenstrual tension syndrome: Secondary | ICD-10-CM | POA: Diagnosis not present

## 2015-08-11 DIAGNOSIS — G43829 Menstrual migraine, not intractable, without status migrainosus: Secondary | ICD-10-CM

## 2015-08-11 HISTORY — DX: Menstrual migraine, not intractable, without status migrainosus: G43.829

## 2015-08-11 MED ORDER — NARATRIPTAN HCL 2.5 MG PO TABS: 2.5000 mg | ORAL_TABLET | Freq: Two times a day (BID) | ORAL | Status: DC | PRN

## 2015-08-11 NOTE — Progress Notes (Signed)
Reason for visit: Menstrual migraine  Referring physician: Dr. Farley LyLavoie  Tiffany Webb is a 45 y.o. female  History of present illness:  [name] is a 45 year old right-handed white female with a history of menstrual migraine dating back almost 15 years. The patient has noted in the past that during pregnancy she had no headaches, and she had no headaches while breast-feeding. The patient will have onset of headache during the menstrual cycle or just afterwards, and the headache will usually last 2-4 days. The headache may be on the right or the left side in the frontotemporal area, but also spreads to the occipital area at the base of the neck. The patient denies any throbbing sensations, she has a constant pain which may be associated with nausea. The patient denies any definite photophobia or phonophobia, she denies any numbness or weakness of extremities and she denies any cognitive clouding with the headache. The headache occasionally may become so severe that she cannot function. She denies any other activating factors other than the menstrual cycle. She denies a family history of headache. She has had a prior Bell's palsy on the right during pregnancy. In the past she has taken Imitrex which was not helpful. She takes Excedrin Migraine for the headache with modest benefit. She is sent to this office for an evaluation.  Past Medical History  Diagnosis Date  . Migraines   . Abnormal Pap smear of cervix   . No pertinent past medical history   . PONV (postoperative nausea and vomiting)   . AMA (advanced maternal age) multigravida 35+   . Hypothyroidism   . Gonadotropin resistant ovary syndrome   . Hx of Bell's palsy     several months ago  . Menstrual migraine 08/11/2015    Past Surgical History  Procedure Laterality Date  . Gynecologic cryosurgery    . Wisdom tooth extraction    . Dilation and evacuation  01/08/2011    Procedure: DILATATION AND EVACUATION (D&E) 2ND TRIMESTER;   Surgeon: Genia DelMarie-Lyne Lavoie;  Location: [facility];  Service: Gynecology;  Laterality: N/A;    Family History  Problem Relation Age of Onset  . Osteopenia Mother   . Asthma Father   . Graves' disease Sister   . Migraines Neg Hx     Social history:  reports that she has never smoked. She has never used smokeless tobacco. She reports that she drinks alcohol. She reports that she does not use illicit drugs.  Medications:  Prior to Admission medications   Medication Sig Start Date End Date Taking? Authorizing Provider  Calcium Carbonate (RA CALCIUM 600 PO) Take 1 tablet by mouth daily.    Historical Provider, MD  HYDROcodone-acetaminophen (NORCO) 5-325 MG per tablet Take 1-2 tablets by mouth every 6 (six) hours as needed for severe pain. 12/01/13   Mercedes Camprubi-Soms, PA-C  ibuprofen (ADVIL,MOTRIN) 600 MG tablet Take 1 tablet (600 mg total) by mouth every 6 (six) hours. 07/17/12   Marlinda Mikeanya Bailey, CNM  Prenatal Vit-Fe Fumarate-FA (PRENATAL MULTIVITAMIN) TABS tablet Take 1 tablet by mouth daily at 12 noon.    Historical Provider, MD     No Active Allergies  ROS:  Out of a complete 14 system review of symptoms, the patient complains only of the following symptoms, and all other reviewed systems are negative.  Headache  Blood pressure 103/72, pulse 87, height 5\' 4"  (1.626 m), weight 122 lb 8 oz (55.566 kg), unknown if currently breastfeeding.  Physical Exam  General: The patient is  alert and cooperative at the time of the examination.  Eyes: Pupils are equal, round, and reactive to light. Discs are flat bilaterally.  Neck: The neck is supple, no carotid bruits are noted.  Respiratory: The respiratory examination is clear.  Cardiovascular: The cardiovascular examination reveals a regular rate and rhythm, no obvious murmurs or rubs are noted.  Skin: Extremities are without significant edema.  Neurologic Exam  Mental status: The patient is alert and oriented x 3 at the time of the  examination. The patient has apparent normal recent and remote memory, with an apparently normal attention span and concentration ability.  Cranial nerves: Facial symmetry is present. There is good sensation of the face to pinprick and soft touch bilaterally. The strength of the facial muscles and the muscles to head turning and shoulder shrug are normal bilaterally. Speech is well enunciated, no aphasia or dysarthria is noted. Extraocular movements are full. Visual fields are full. The tongue is midline, and the patient has symmetric elevation of the soft palate. No obvious hearing deficits are noted.  Motor: The motor testing reveals 5 over 5 strength of all 4 extremities. Good symmetric motor tone is noted throughout.  Sensory: Sensory testing is intact to pinprick, soft touch, vibration sensation, and position sense on all 4 extremities. No evidence of extinction is noted.  Coordination: Cerebellar testing reveals good finger-nose-finger and heel-to-shin bilaterally.  Gait and station: Gait is normal. Tandem gait is normal. Romberg is negative. No drift is seen.  Reflexes: Deep tendon reflexes are symmetric and normal bilaterally. Toes are downgoing bilaterally.   Assessment/Plan:  1. Menstrual migraine  The patient is having one headache a month, but the headache may last 2-4 days. The patient will be given a prescription for a long-acting triptan medication, Amerge. She will take 1 tablet twice daily for 3 days at the onset of the headache. If this is not effective, she is contact our office. She will follow-up otherwise in 6 months, sooner if needed. She does not wish to have medications for nausea.  Marlan Palau. Keith Madeeha Costantino MD 08/11/2015 7:44 PM  [facility] [address] [place] [id number]  Phone [phone] Fax [phone]

## 2015-08-11 NOTE — Patient Instructions (Signed)

## 2015-08-15 ENCOUNTER — Other Ambulatory Visit: Payer: Self-pay | Admitting: Neurology

## 2015-08-15 ENCOUNTER — Telehealth: Payer: Self-pay | Admitting: Neurology

## 2015-08-15 MED ORDER — DEXAMETHASONE 2 MG PO TABS
ORAL_TABLET | ORAL | Status: DC
Start: 2015-08-15 — End: 2016-10-25

## 2015-08-15 NOTE — Telephone Encounter (Signed)
Patient is calling about her migraines and would like a call back. Thanks!

## 2015-08-15 NOTE — Telephone Encounter (Signed)
I called the patient. The patient is having a severe headache over the weekend, the long-acting Amerge seem to suppress the headache for several days, but this is the fourth day, and the headache has rebounded, she will come in for a Depacon injection.

## 2015-08-15 NOTE — Telephone Encounter (Signed)
Pt arrived for Depacon by [facility].

## 2015-09-12 ENCOUNTER — Telehealth: Payer: Self-pay | Admitting: Neurology

## 2015-09-12 MED ORDER — RIZATRIPTAN BENZOATE 10 MG PO TBDP: 10.0000 mg | ORAL_TABLET | Freq: Three times a day (TID) | ORAL | 3 refills | Status: DC | PRN

## 2015-09-12 NOTE — Telephone Encounter (Signed)
I called patient. The patient indicates that the Amerge is not extremely helpful. Imitrex in the past is also not been helpful. I will try Maxalt to see if this helps, the patient may not be someone who responds to triptan medications.

## 2015-09-12 NOTE — Telephone Encounter (Signed)
Patient called to request different medication, states "naratriptan (AMERGE) 2.5 MG tablet doesn't seem to be kicking in", has had migraine since last night, she took 1 naratriptan last night and 1 naratriptan this morning and she still has persistent migraine.

## 2015-09-13 ENCOUNTER — Telehealth: Payer: Self-pay | Admitting: Neurology

## 2015-09-13 NOTE — Telephone Encounter (Signed)
Patient is calling and asking if she can take Excedrin Migraine with her medication rizatriptan 10 mg disintegrating tablets.  Please call.

## 2015-09-13 NOTE — Telephone Encounter (Signed)
Returned pt TC. Advised that Excedrin and Maxalt can usually be used for migraines w/o commonly reported interactions. Informed her that taking Excedrin w/ other anti-inflammatory medications such as ibuprofen could cause GI upset. Verbalized understanding and appreciation for call.

## 2015-09-20 DIAGNOSIS — F4320 Adjustment disorder, unspecified: Secondary | ICD-10-CM | POA: Diagnosis not present

## 2015-10-20 ENCOUNTER — Telehealth: Payer: Self-pay | Admitting: Neurology

## 2015-10-20 MED ORDER — NARATRIPTAN HCL 2.5 MG PO TABS: 2.5000 mg | ORAL_TABLET | Freq: Two times a day (BID) | ORAL | 3 refills | Status: DC | PRN

## 2015-10-20 NOTE — Telephone Encounter (Addendum)
Patient called to [facility] [place], states she was on this medication previously and would like to switch back to this medication. Patient adds that she needs this medication ASAP, states she has a migraine and requests that nurse call her back [phone].

## 2015-10-20 NOTE — Telephone Encounter (Signed)
I called the patient, the prescription was called into the pharmacy.

## 2015-12-16 ENCOUNTER — Encounter: Payer: Self-pay | Admitting: Neurology

## 2016-02-14 ENCOUNTER — Ambulatory Visit: Payer: BLUE CROSS/BLUE SHIELD | Admitting: Neurology

## 2016-09-11 ENCOUNTER — Other Ambulatory Visit: Payer: Self-pay | Admitting: Obstetrics & Gynecology

## 2016-09-11 DIAGNOSIS — Z1231 Encounter for screening mammogram for malignant neoplasm of breast: Secondary | ICD-10-CM

## 2016-09-20 ENCOUNTER — Emergency Department (HOSPITAL_COMMUNITY)
Admission: EM | Admit: 2016-09-20 | Discharge: 2016-09-20 | Disposition: A | Discharge status: Home / Self Care | Payer: Self-pay

## 2016-10-02 ENCOUNTER — Ambulatory Visit: Payer: Self-pay

## 2016-10-10 ENCOUNTER — Encounter: Payer: BLUE CROSS/BLUE SHIELD | Admitting: Obstetrics & Gynecology

## 2016-10-22 ENCOUNTER — Encounter: Payer: BLUE CROSS/BLUE SHIELD | Admitting: Obstetrics & Gynecology

## 2016-10-23 ENCOUNTER — Ambulatory Visit
Admission: RE | Admit: 2016-10-23 | Discharge: 2016-10-23 | Disposition: A | Discharge status: Home / Self Care | Payer: BLUE CROSS/BLUE SHIELD | Source: Ambulatory Visit | Attending: Obstetrics & Gynecology | Admitting: Obstetrics & Gynecology

## 2016-10-23 ENCOUNTER — Encounter (INDEPENDENT_AMBULATORY_CARE_PROVIDER_SITE_OTHER): Payer: Self-pay

## 2016-10-23 DIAGNOSIS — Z1231 Encounter for screening mammogram for malignant neoplasm of breast: Secondary | ICD-10-CM

## 2016-10-25 ENCOUNTER — Ambulatory Visit (INDEPENDENT_AMBULATORY_CARE_PROVIDER_SITE_OTHER): Payer: BLUE CROSS/BLUE SHIELD | Admitting: Obstetrics & Gynecology

## 2016-10-25 ENCOUNTER — Encounter: Payer: Self-pay | Admitting: Obstetrics & Gynecology

## 2016-10-25 VITALS — BP 114/70 | Ht 63.5 in | Wt 126.6 lb

## 2016-10-25 DIAGNOSIS — N951 Menopausal and female climacteric states: Secondary | ICD-10-CM

## 2016-10-25 DIAGNOSIS — Z3009 Encounter for other general counseling and advice on contraception: Secondary | ICD-10-CM | POA: Diagnosis not present

## 2016-10-25 DIAGNOSIS — Z1151 Encounter for screening for human papillomavirus (HPV): Secondary | ICD-10-CM

## 2016-10-25 DIAGNOSIS — Z01419 Encounter for gynecological examination (general) (routine) without abnormal findings: Secondary | ICD-10-CM | POA: Diagnosis not present

## 2016-10-25 NOTE — Patient Instructions (Signed)
1. Encounter for routine gynecological examination with Papanicolaou smear of cervix Normal gyn exam.  Pap/HPV done.  Breasts wnl.  Screening Mammo neg 10/2016.  F/U Fasting blood work. - CBC; Future - Comp Met (CMET); Future - TSH; Future - Vitamin D 1,25 dihydroxy; Future - Lipid panel; Future  2. Perimenopause Oligomenorrhea.  No Vasomotor Sxs.  Recommend to call for evaluation with Pelvic US if irregular and frequent bleeding or very heavy bleeding.  If spacing cycles >3 months, please call to receive a Progestin to bring a period and avoid thickening of the Endometrial lining.  If severe Menopausal Sx, please schedule a visit to discuss management.  I recommend Vit D supplements/Ca++ in nutrition and weight bearing physical activity.  3. Encounter for other general counseling or advice on contraception Declines contraception at this time.  Condoms as needed.  Tiffany Webb, it was a pleasure to see you today!  I will inform you of your results as soon as available.  Happy that Tiffany Webb is doing well!  Health Maintenance, Female Adopting a healthy lifestyle and getting preventive care can go a long way to promote health and wellness. Talk with your health care provider about what schedule of regular examinations is right for you. This is a good chance for you to check in with your provider about disease prevention and staying healthy. In between checkups, there are plenty of things you can do on your own. Experts have done a lot of research about which lifestyle changes and preventive measures are most likely to keep you healthy. Ask your health care provider for more information. Weight and diet Eat a healthy diet  Be sure to include plenty of vegetables, fruits, low-fat dairy products, and lean protein.  Do not eat a lot of foods high in solid fats, added sugars, or salt.  Get regular exercise. This is one of the most important things you can do for your health. ? Most adults should exercise for  at least 150 minutes each week. The exercise should increase your heart rate and make you sweat (moderate-intensity exercise). ? Most adults should also do strengthening exercises at least twice a week. This is in addition to the moderate-intensity exercise.  Maintain a healthy weight  Body mass index (BMI) is a measurement that can be used to identify possible weight problems. It estimates body fat based on height and weight. Your health care provider can help determine your BMI and help you achieve or maintain a healthy weight.  For females 21 years of age and older: ? A BMI below 18.5 is considered underweight. ? A BMI of 18.5 to 24.9 is normal. ? A BMI of 25 to 29.9 is considered overweight. ? A BMI of 30 and above is considered obese.  Watch levels of cholesterol and blood lipids  You should start having your blood tested for lipids and cholesterol at 48 years of age, then have this test every 5 years.  You may need to have your cholesterol levels checked more often if: ? Your lipid or cholesterol levels are high. ? You are older than 48 years of age. ? You are at high risk for heart disease.  Cancer screening Lung Cancer  Lung cancer screening is recommended for adults 70-66 years old who are at high risk for lung cancer because of a history of smoking.  A yearly low-dose CT scan of the lungs is recommended for people who: ? Currently smoke. ? Have quit within the past 15 years. ? Have  at least a 30-pack-year history of smoking. A pack year is smoking an average of one pack of cigarettes a day for 1 year.  Yearly screening should continue until it has been 15 years since you quit.  Yearly screening should stop if you develop a health problem that would prevent you from having lung cancer treatment.  Breast Cancer  Practice breast self-awareness. This means understanding how your breasts normally appear and feel.  It also means doing regular breast self-exams. Let your  health care provider know about any changes, no matter how small.  If you are in your 20s or 30s, you should have a clinical breast exam (CBE) by a health care provider every 1-3 years as part of a regular health exam.  If you are 49 or older, have a CBE every year. Also consider having a breast X-ray (mammogram) every year.  If you have a family history of breast cancer, talk to your health care provider about genetic screening.  If you are at high risk for breast cancer, talk to your health care provider about having an MRI and a mammogram every year.  Breast cancer gene (BRCA) assessment is recommended for women who have family members with BRCA-related cancers. BRCA-related cancers include: ? Breast. ? Ovarian. ? Tubal. ? Peritoneal cancers.  Results of the assessment will determine the need for genetic counseling and BRCA1 and BRCA2 testing.  Cervical Cancer Your health care provider may recommend that you be screened regularly for cancer of the pelvic organs (ovaries, uterus, and vagina). This screening involves a pelvic examination, including checking for microscopic changes to the surface of your cervix (Pap test). You may be encouraged to have this screening done every 3 years, beginning at age 28.  For women ages 69-65, health care providers may recommend pelvic exams and Pap testing every 3 years, or they may recommend the Pap and pelvic exam, combined with testing for human papilloma virus (HPV), every 5 years. Some types of HPV increase your risk of cervical cancer. Testing for HPV may also be done on women of any age with unclear Pap test results.  Other health care providers may not recommend any screening for nonpregnant women who are considered low risk for pelvic cancer and who do not have symptoms. Ask your health care provider if a screening pelvic exam is right for you.  If you have had past treatment for cervical cancer or a condition that could lead to cancer, you need  Pap tests and screening for cancer for at least 20 years after your treatment. If Pap tests have been discontinued, your risk factors (such as having a new sexual partner) need to be reassessed to determine if screening should resume. Some women have medical problems that increase the chance of getting cervical cancer. In these cases, your health care provider may recommend more frequent screening and Pap tests.  Colorectal Cancer  This type of cancer can be detected and often prevented.  Routine colorectal cancer screening usually begins at 48 years of age and continues through 48 years of age.  Your health care provider may recommend screening at an earlier age if you have risk factors for colon cancer.  Your health care provider may also recommend using home test kits to check for hidden blood in the stool.  A small camera at the end of a tube can be used to examine your colon directly (sigmoidoscopy or colonoscopy). This is done to check for the earliest forms of colorectal cancer.  Routine screening usually begins at age 40.  Direct examination of the colon should be repeated every 5-10 years through 48 years of age. However, you may need to be screened more often if early forms of precancerous polyps or small growths are found.  Skin Cancer  Check your skin from head to toe regularly.  Tell your health care provider about any new moles or changes in moles, especially if there is a change in a mole's shape or color.  Also tell your health care provider if you have a mole that is larger than the size of a pencil eraser.  Always use sunscreen. Apply sunscreen liberally and repeatedly throughout the day.  Protect yourself by wearing long sleeves, pants, a wide-brimmed hat, and sunglasses whenever you are outside.  Heart disease, diabetes, and high blood pressure  High blood pressure causes heart disease and increases the risk of stroke. High blood pressure is more likely to develop  in: ? People who have blood pressure in the high end of the normal range (130-139/85-89 mm Hg). ? People who are overweight or obese. ? People who are African American.  If you are 90-56 years of age, have your blood pressure checked every 3-5 years. If you are 67 years of age or older, have your blood pressure checked every year. You should have your blood pressure measured twice-once when you are at a hospital or clinic, and once when you are not at a hospital or clinic. Record the average of the two measurements. To check your blood pressure when you are not at a hospital or clinic, you can use: ? An automated blood pressure machine at a pharmacy. ? A home blood pressure monitor.  If you are between 16 years and 33 years old, ask your health care provider if you should take aspirin to prevent strokes.  Have regular diabetes screenings. This involves taking a blood sample to check your fasting blood sugar level. ? If you are at a normal weight and have a low risk for diabetes, have this test once every three years after 48 years of age. ? If you are overweight and have a high risk for diabetes, consider being tested at a younger age or more often. Preventing infection Hepatitis B  If you have a higher risk for hepatitis B, you should be screened for this virus. You are considered at high risk for hepatitis B if: ? You were born in a country where hepatitis B is common. Ask your health care provider which countries are considered high risk. ? Your parents were born in a high-risk country, and you have not been immunized against hepatitis B (hepatitis B vaccine). ? You have HIV or AIDS. ? You use needles to inject street drugs. ? You live with someone who has hepatitis B. ? You have had sex with someone who has hepatitis B. ? You get hemodialysis treatment. ? You take certain medicines for conditions, including cancer, organ transplantation, and autoimmune conditions.  Hepatitis C  Blood  testing is recommended for: ? Everyone born from [birth year] through 1967. ? Anyone with known risk factors for hepatitis C.  Sexually transmitted infections (STIs)  You should be screened for sexually transmitted infections (STIs) including gonorrhea and chlamydia if: ? You are sexually active and are younger than 48 years of age. ? You are older than 48 years of age and your health care provider tells you that you are at risk for this type of infection. ? Your sexual activity  has changed since you were last screened and you are at an increased risk for chlamydia or gonorrhea. Ask your health care provider if you are at risk.  If you do not have HIV, but are at risk, it may be recommended that you take a prescription medicine daily to prevent HIV infection. This is called pre-exposure prophylaxis (PrEP). You are considered at risk if: ? You are sexually active and do not regularly use condoms or know the HIV status of your partner(s). ? You take drugs by injection. ? You are sexually active with a partner who has HIV.  Talk with your health care provider about whether you are at high risk of being infected with HIV. If you choose to begin PrEP, you should first be tested for HIV. You should then be tested every 3 months for as long as you are taking PrEP. Pregnancy  If you are premenopausal and you may become pregnant, ask your health care provider about preconception counseling.  If you may become pregnant, take 400 to 800 micrograms (mcg) of folic acid every day.  If you want to prevent pregnancy, talk to your health care provider about birth control (contraception). Osteoporosis and menopause  Osteoporosis is a disease in which the bones lose minerals and strength with aging. This can result in serious bone fractures. Your risk for osteoporosis can be identified using a bone density scan.  If you are 29 years of age or older, or if you are at risk for osteoporosis and fractures, ask your  health care provider if you should be screened.  Ask your health care provider whether you should take a calcium or vitamin D supplement to lower your risk for osteoporosis.  Menopause may have certain physical symptoms and risks.  Hormone replacement therapy may reduce some of these symptoms and risks. Talk to your health care provider about whether hormone replacement therapy is right for you. Follow these instructions at home:  Schedule regular health, dental, and eye exams.  Stay current with your immunizations.  Do not use any tobacco products including cigarettes, chewing tobacco, or electronic cigarettes.  If you are pregnant, do not drink alcohol.  If you are breastfeeding, limit how much and how often you drink alcohol.  Limit alcohol intake to no more than 1 drink per day for nonpregnant women. One drink equals 12 ounces of beer, 5 ounces of wine, or 1 ounces of hard liquor.  Do not use street drugs.  Do not share needles.  Ask your health care provider for help if you need support or information about quitting drugs.  Tell your health care provider if you often feel depressed.  Tell your health care provider if you have ever been abused or do not feel safe at home. This information is not intended to replace advice given to you by your health care provider. Make sure you discuss any questions you have with your health care provider. Document Released: 08/14/2010 Document Revised: 07/07/2015 Document Reviewed: 11/02/2014 Elsevier Interactive Patient Education  [employer].

## 2016-10-25 NOTE — Progress Notes (Signed)
Tiffany Webb 02/21/1970 [id number]   History:    46 y.o.  [id number] Married.  Husband has a piano restoration business.  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 Tiffany Webb is 46 yo, doing very well.  RP:  Established patient presenting for annual gyn exam   HPI:  Menses usually every month, but occasionally spacing up to 3 months.  1 cycle at 2 wks interval.  No heavy bleeding.  No pelvic pain.  Normal secretions.  Breasts wnl.  Mictions/BMs wnl.  Planning to increase physical activity.  Past medical history,surgical history, family history and social history were all reviewed and documented in the EPIC chart.  Gynecologic History Patient's last menstrual period was 10/09/2016 (approximate). Contraception: none Last Pap: 04/2015. Results were: Normal/HPV HR neg Last mammogram: 10/2016. 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  GENERAL: No fevers or chills. HEENT: No change in vision, no earache, sore throat or sinus congestion. NECK: No pain or stiffness. CARDIOVASCULAR: No chest pain or pressure. No palpitations. PULMONARY: No shortness of breath, cough or wheeze. GASTROINTESTINAL: No abdominal pain, nausea, vomiting or diarrhea, melena or bright red blood per rectum. GENITOURINARY: No urinary frequency, urgency, hesitancy or dysuria. MUSCULOSKELETAL: No joint or muscle pain, no back pain, no recent trauma. DERMATOLOGIC: No rash, no itching, no lesions. ENDOCRINE: No polyuria, polydipsia, no heat or cold intolerance. No recent change in weight.  HEMATOLOGICAL: No anemia or easy bruising or bleeding. NEUROLOGIC: No headache, seizures, numbness, tingling or weakness. PSYCHIATRIC: No depression, no loss of interest in normal activity or change in sleep pattern.     Exam:   BP 114/70   Ht 5' 3.5" (1.613 m)   Wt 126 lb 9.6 oz (57.4 kg)   LMP 10/09/2016 (Approximate)   BMI 22.07 kg/m   Body mass index is 22.07 kg/m.  General appearance : Well developed well nourished female. No acute distress HEENT: Eyes: no retinal hemorrhage or exudates,  Neck supple, trachea midline, no carotid bruits, no thyroidmegaly Lungs: Clear to auscultation, no rhonchi or wheezes, or rib retractions  Heart: Regular rate and rhythm, no murmurs or gallops Breast:Examined in sitting and supine position were symmetrical in appearance, no palpable masses or tenderness,  no skin retraction, no nipple inversion, no nipple discharge, no skin discoloration, no axillary or supraclavicular lymphadenopathy Abdomen: no palpable masses or tenderness, no rebound or guarding Extremities: no edema or skin discoloration or tenderness  Pelvic: Vulva normal  Bartholin, Urethra, Skene Glands: Within normal limits             Vagina: No gross lesions or discharge  Cervix: No gross lesions or discharge.  Pap/HPV done.  Uterus  AV, normal size, shape and consistency, non-tender and mobile  Adnexa  Without masses or tenderness  Anus and perineum  normal    Assessment/Plan:  46 y.o. female for annual exam   1. Encounter for routine gynecological examination with Papanicolaou smear of cervix Normal gyn exam.  Pap/HPV done.  Breasts wnl.  Screening Mammo neg 10/2016.  F/U Fasting blood work. - CBC; Future - Comp Met (CMET); Future - TSH; Future - Vitamin D 1,25 dihydroxy; Future - Lipid panel; Future  2. Perimenopause Oligomenorrhea.  No Vasomotor Sxs.  Recommend to call for evaluation with Pelvic US if irregular and frequent bleeding or very heavy bleeding.  If spacing  cycles >3 months, please call to receive a Progestin to bring a period and avoid thickening of the Endometrial lining.  If severe Menopausal Sx, please schedule a visit to discuss management.  I recommend Vit D supplements/Ca++ in nutrition and weight bearing physical activity.  3. Encounter for other general counseling or advice on contraception Declines contraception at this time.  Condoms as needed.  Tiffany Bruins MD, 4:59 PM 10/25/2016

## 2016-10-29 DIAGNOSIS — Z1151 Encounter for screening for human papillomavirus (HPV): Secondary | ICD-10-CM | POA: Diagnosis not present

## 2016-10-29 DIAGNOSIS — Z01419 Encounter for gynecological examination (general) (routine) without abnormal findings: Secondary | ICD-10-CM | POA: Diagnosis not present

## 2016-10-29 NOTE — Addendum Note (Signed)
Addended by: Richardson Chiquito on: 10/29/2016 09:02 AM   Modules accepted: Orders

## 2016-10-30 LAB — PAP IG AND HPV HIGH-RISK: HPV DNA High Risk: NOT DETECTED

## 2016-11-14 DIAGNOSIS — Z23 Encounter for immunization: Secondary | ICD-10-CM | POA: Diagnosis not present

## 2017-03-08 ENCOUNTER — Telehealth: Payer: Self-pay | Admitting: *Deleted

## 2017-03-08 MED ORDER — MEDROXYPROGESTERONE ACETATE 10 MG PO TABS: ORAL_TABLET | ORAL | 4 refills | Status: DC

## 2017-03-08 NOTE — Telephone Encounter (Signed)
Patient called c/o no cycle in at least 2 months, pt said she doesn't remember LMP. Negative UPT,per note on 10/25/16 "If spacing cycles >3 months, please call to receive a Progestin to bring a period and avoid thickening of the Endometrial lining." please advise

## 2017-03-08 NOTE — Telephone Encounter (Signed)
Please send a prescription of Provera 5 mg PO daily x 10 days.  Take every 3 months if no spontaneous menstrual period.  #10, Refill x 4

## 2017-03-08 NOTE — Telephone Encounter (Signed)
Pt informed, Rx sent. 

## 2017-08-28 DIAGNOSIS — M71571 Other bursitis, not elsewhere classified, right ankle and foot: Secondary | ICD-10-CM | POA: Diagnosis not present

## 2017-08-28 DIAGNOSIS — M216X2 Other acquired deformities of left foot: Secondary | ICD-10-CM | POA: Diagnosis not present

## 2017-08-28 DIAGNOSIS — M7752 Other enthesopathy of left foot: Secondary | ICD-10-CM | POA: Diagnosis not present

## 2017-08-28 DIAGNOSIS — M722 Plantar fascial fibromatosis: Secondary | ICD-10-CM | POA: Diagnosis not present

## 2017-08-28 DIAGNOSIS — M7731 Calcaneal spur, right foot: Secondary | ICD-10-CM | POA: Diagnosis not present

## 2017-09-04 DIAGNOSIS — M216X2 Other acquired deformities of left foot: Secondary | ICD-10-CM | POA: Diagnosis not present

## 2017-09-04 DIAGNOSIS — M722 Plantar fascial fibromatosis: Secondary | ICD-10-CM | POA: Diagnosis not present

## 2017-09-04 DIAGNOSIS — M71571 Other bursitis, not elsewhere classified, right ankle and foot: Secondary | ICD-10-CM | POA: Diagnosis not present

## 2017-10-31 ENCOUNTER — Ambulatory Visit (INDEPENDENT_AMBULATORY_CARE_PROVIDER_SITE_OTHER): Payer: Self-pay

## 2017-10-31 ENCOUNTER — Encounter (INDEPENDENT_AMBULATORY_CARE_PROVIDER_SITE_OTHER): Payer: Self-pay | Admitting: Family Medicine

## 2017-10-31 ENCOUNTER — Ambulatory Visit (INDEPENDENT_AMBULATORY_CARE_PROVIDER_SITE_OTHER): Payer: BLUE CROSS/BLUE SHIELD | Admitting: Family Medicine

## 2017-10-31 DIAGNOSIS — M542 Cervicalgia: Secondary | ICD-10-CM

## 2017-10-31 DIAGNOSIS — G8929 Other chronic pain: Secondary | ICD-10-CM

## 2017-10-31 DIAGNOSIS — E559 Vitamin D deficiency, unspecified: Secondary | ICD-10-CM | POA: Diagnosis not present

## 2017-10-31 DIAGNOSIS — R51 Headache: Secondary | ICD-10-CM

## 2017-10-31 NOTE — Progress Notes (Signed)
Office Visit Note   Patient: Tiffany MonarchSarah Webb           Date of Birth: Mar 07, 1970           MRN: [id number] Visit Date: 10/31/2017 Requested by: Shirlean MylarWebb, Carol, MD [address] [place], [place] [id number] PCP: Shirlean MylarWebb, Carol, MD  Subjective: Chief Complaint  Patient presents with  . Neck - Pain    Pain x years, along with headaches.  This flareup started last Friday.  Pain is worse on the right side of the neck, but has a little pain on the left as well.    HPI: She was referred by Dr. Jonni SangerSzott for neck pain and headaches.  For about 10 years she has had intermittent headaches.  She has been to headache specialist who treated her with triptan's.  She never got any relief with pain, and one day she even had an IV infusion which did not get rid of her headache.  Her headaches are usually right-sided, not associated with aura.  She has had neck pain along with the headaches for a while.  She went to her dentist asking whether she might have TMJ but she did not think that was the case.  She was referred here for additional evaluation.  She has a history of vitamin D deficiency but does not take anything for that.  She had thyroid dysfunction during pregnancy but apparently it resolved.  No family history of spine problems or headache disorders.              ROS: All other systems were reviewed and are negative.  Objective: Vital Signs: There were no vitals taken for this visit.  Physical Exam:  Neck: Excellent range of motion with negative Spurling's test.  She has several tender trigger points in the right cervical paraspinous muscles.  Upper extremity strength and reflexes are normal.  Imaging: 2 view cervical spine x-rays: Anatomic alignment, no congenital deformities.  She has some very early endplate degenerative changes at C4-5 and C5-6.  Assessment & Plan: 1.  Chronic neck pain and headaches -We will order some additional labs.  I presume she is still low in vitamin D so  she will start taking it.  Referral to physical therapy [facility](Nikki Rogers).  Consider MRI scan if symptoms persist.   Follow-Up Instructions: No follow-ups on file.       Procedures: None today.   PMFS History: Patient Active Problem List   Diagnosis Date Noted  . Menstrual migraine 08/11/2015  . Family history of thyroid disease 12/15/2013  . High serum thyroid stimulating hormone (TSH) 12/15/2013  . History of dilatation and curettage 12/15/2013  . History of infertility 12/15/2013  . 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 SVD (spontaneous vaginal delivery) 07/15/2012  . Postpartum care following vaginal delivery (6/3) 07/15/2012  . 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 AMA (advanced maternal age) multigravida 35+ 12/21/2010  . Abnormal first trimester screen 12/21/2010   Past Medical History:  Diagnosis Date  . Abnormal Pap smear of cervix   . AMA (advanced maternal age) multigravida 35+   . Gonadotropin resistant ovary syndrome   . Hx of Bell's palsy    several months ago  . Hypothyroidism   . Menstrual migraine 08/11/2015  . Migraines   . No pertinent past medical history   . PONV (postoperative nausea and vomiting)     Family History  Problem Relation Age of Onset  .  Osteopenia Mother   . Asthma Father   . Graves' disease Sister   . Migraines Neg Hx   . Breast cancer Neg Hx     Past Surgical History:  Procedure Laterality Date  . DILATION AND EVACUATION  01/08/2011   Procedure: DILATATION AND EVACUATION (D&E) 2ND TRIMESTER;  Surgeon: Genia Del;  Location: [facility];  Service: Gynecology;  Laterality: N/A;  . GYNECOLOGIC CRYOSURGERY    . WISDOM TOOTH EXTRACTION     Social History   Occupational History    Employer: [employer]  Tobacco Use  . Smoking status: Never Smoker  . Smokeless tobacco: Never Used  Substance and Sexual Activity  . Alcohol use: Yes    Alcohol/week: 0.0 standard drinks     Comment: 0-2 drinks per night  . Drug use: No  . Sexual activity: Yes    Birth control/protection: None

## 2017-12-28 DIAGNOSIS — Z23 Encounter for immunization: Secondary | ICD-10-CM | POA: Diagnosis not present

## 2018-01-14 ENCOUNTER — Other Ambulatory Visit: Payer: Self-pay | Admitting: Obstetrics & Gynecology

## 2018-01-14 ENCOUNTER — Telehealth: Payer: Self-pay | Admitting: *Deleted

## 2018-01-14 DIAGNOSIS — Z1329 Encounter for screening for other suspected endocrine disorder: Secondary | ICD-10-CM

## 2018-01-14 DIAGNOSIS — Z1321 Encounter for screening for nutritional disorder: Secondary | ICD-10-CM

## 2018-01-14 DIAGNOSIS — Z1231 Encounter for screening mammogram for malignant neoplasm of breast: Secondary | ICD-10-CM

## 2018-01-14 DIAGNOSIS — Z01419 Encounter for gynecological examination (general) (routine) without abnormal findings: Secondary | ICD-10-CM

## 2018-01-14 DIAGNOSIS — Z13220 Encounter for screening for lipoid disorders: Secondary | ICD-10-CM

## 2018-01-14 NOTE — Telephone Encounter (Signed)
Patient has annual exam scheduled on 04/02/17, would like labs done prior exam. Please advise

## 2018-01-14 NOTE — Telephone Encounter (Signed)
Orders placed, coming on 01/20/18 @ 8:45am

## 2018-01-14 NOTE — Telephone Encounter (Signed)
Agree, please order CBC, CMP, FLP, TSH, Vit D.

## 2018-01-20 ENCOUNTER — Other Ambulatory Visit: Payer: BLUE CROSS/BLUE SHIELD

## 2018-01-20 DIAGNOSIS — Z01419 Encounter for gynecological examination (general) (routine) without abnormal findings: Secondary | ICD-10-CM | POA: Diagnosis not present

## 2018-01-20 DIAGNOSIS — Z1329 Encounter for screening for other suspected endocrine disorder: Secondary | ICD-10-CM

## 2018-01-20 DIAGNOSIS — Z1321 Encounter for screening for nutritional disorder: Secondary | ICD-10-CM

## 2018-01-20 DIAGNOSIS — Z13220 Encounter for screening for lipoid disorders: Secondary | ICD-10-CM

## 2018-01-21 LAB — CBC
HEMATOCRIT: 37 % (ref 35.0–45.0)
Hemoglobin: 11.9 g/dL (ref 11.7–15.5)
MCH: 29.5 pg (ref 27.0–33.0)
MCHC: 32.2 g/dL (ref 32.0–36.0)
MCV: 91.6 fL (ref 80.0–100.0)
MPV: 10.3 fL (ref 7.5–12.5)
Platelets: 270 10*3/uL (ref 140–400)
RBC: 4.04 10*6/uL (ref 3.80–5.10)
RDW: 12.8 % (ref 11.0–15.0)
WBC: 4.7 10*3/uL (ref 3.8–10.8)

## 2018-01-21 LAB — COMPREHENSIVE METABOLIC PANEL
AG Ratio: 1.9 (calc) (ref 1.0–2.5)
ALT: 14 U/L (ref 6–29)
AST: 19 U/L (ref 10–35)
Albumin: 4.5 g/dL (ref 3.6–5.1)
Alkaline phosphatase (APISO): 49 U/L (ref 33–115)
BUN: 12 mg/dL (ref 7–25)
CO2: 24 mmol/L (ref 20–32)
Calcium: 9.2 mg/dL (ref 8.6–10.2)
Chloride: 105 mmol/L (ref 98–110)
Creat: 0.67 mg/dL (ref 0.50–1.10)
Globulin: 2.4 g/dL (calc) (ref 1.9–3.7)
Glucose, Bld: 84 mg/dL (ref 65–99)
Potassium: 4.1 mmol/L (ref 3.5–5.3)
Sodium: 138 mmol/L (ref 135–146)
Total Bilirubin: 0.5 mg/dL (ref 0.2–1.2)
Total Protein: 6.9 g/dL (ref 6.1–8.1)

## 2018-01-21 LAB — LIPID PANEL
Cholesterol: 189 mg/dL (ref <200)
HDL: 80 mg/dL (ref >50)
LDL Cholesterol (Calc): 95 mg/dL (calc)
Non-HDL Cholesterol (Calc): 109 mg/dL (calc) (ref <130)
Total CHOL/HDL Ratio: 2.4 (calc) (ref <5.0)
Triglycerides: 56 mg/dL (ref <150)

## 2018-01-21 LAB — VITAMIN D 25 HYDROXY (VIT D DEFICIENCY, FRACTURES): Vit D, 25-Hydroxy: 27 ng/mL — ABNORMAL LOW (ref 30–100)

## 2018-01-21 LAB — TSH: TSH: 2.72 m[IU]/L

## 2018-01-22 ENCOUNTER — Ambulatory Visit
Admission: RE | Admit: 2018-01-22 | Discharge: 2018-01-22 | Disposition: A | Discharge status: Home / Self Care | Payer: BLUE CROSS/BLUE SHIELD | Source: Ambulatory Visit | Attending: Obstetrics & Gynecology | Admitting: Obstetrics & Gynecology

## 2018-01-22 DIAGNOSIS — Z1231 Encounter for screening mammogram for malignant neoplasm of breast: Secondary | ICD-10-CM | POA: Diagnosis not present

## 2018-03-24 ENCOUNTER — Encounter: Payer: Self-pay | Admitting: Obstetrics & Gynecology

## 2018-03-24 ENCOUNTER — Ambulatory Visit (INDEPENDENT_AMBULATORY_CARE_PROVIDER_SITE_OTHER): Payer: BLUE CROSS/BLUE SHIELD | Admitting: Obstetrics & Gynecology

## 2018-03-24 VITALS — BP 120/76 | Ht 63.75 in | Wt 124.6 lb

## 2018-03-24 DIAGNOSIS — N914 Secondary oligomenorrhea: Secondary | ICD-10-CM | POA: Diagnosis not present

## 2018-03-24 DIAGNOSIS — Z01419 Encounter for gynecological examination (general) (routine) without abnormal findings: Secondary | ICD-10-CM | POA: Diagnosis not present

## 2018-03-24 DIAGNOSIS — N951 Menopausal and female climacteric states: Secondary | ICD-10-CM

## 2018-03-24 MED ORDER — MEDROXYPROGESTERONE ACETATE 10 MG PO TABS: ORAL_TABLET | ORAL | 4 refills | Status: DC

## 2018-03-24 NOTE — Progress Notes (Signed)
Tiffany Webb [birth year] [id number]   History:    48 y.o. G3P1A2L1 Married.  Son is 58 yo in kindergarten  RP:  Established patient presenting for annual gyn exam   HPI: Oligomenorrhea with LMP 12/2017.  No pelvic pain.  No hot flushes or night sweats.  No pain with IC.  Breasts normal.  BMI 21.56.  Physically active.  Health labs with Fam MD.  Past medical history,surgical history, family history and social history were all reviewed and documented in the EPIC chart.  Gynecologic History Patient's last menstrual period was 12/22/2017. Contraception: none  Last Pap: 10/2016. Results were: Negative/HPV HR neg Last mammogram: 01/2018. Results were: Negative Bone Density: Never Colonoscopy: Never  Obstetric History OB History  Gravida Para Term Preterm AB Living  3 1 1  0 2 1  SAB TAB Ectopic Multiple Live Births  1 1 0 0 1    # Outcome Date GA Lbr Len/2nd Weight Sex Delivery Anes PTL Lv  3 Term 07/15/12 [redacted]w[redacted]d 13:19 / 01:55 5 lb 13 oz (2.637 kg) M Vag-Vacuum EPI  LIV  2 TAB 2012             Birth Comments: Mosaic Trisomy 21  1 SAB 2011             ROS: A ROS was performed and pertinent positives and negatives are included in the history.  GENERAL: No fevers or chills. HEENT: No change in vision, no earache, sore throat or sinus congestion. NECK: No pain or stiffness. CARDIOVASCULAR: No chest pain or pressure. No palpitations. PULMONARY: No shortness of breath, cough or wheeze. GASTROINTESTINAL: No abdominal pain, nausea, vomiting or diarrhea, melena or bright red blood per rectum. GENITOURINARY: No urinary frequency, urgency, hesitancy or dysuria. MUSCULOSKELETAL: No joint or muscle pain, no back pain, no recent trauma. DERMATOLOGIC: No rash, no itching, no lesions. ENDOCRINE: No polyuria, polydipsia, no heat or cold intolerance. No recent change in weight. HEMATOLOGICAL: No anemia or easy bruising or bleeding. NEUROLOGIC: No headache, seizures, numbness, tingling or weakness.  PSYCHIATRIC: No depression, no loss of interest in normal activity or change in sleep pattern.     Exam:   BP 120/76   Ht 5' 3.75" (1.619 m)   Wt 124 lb 9.6 oz (56.5 kg)   LMP 12/22/2017   BMI 21.56 kg/m   Body mass index is 21.56 kg/m.  General appearance : Well developed well nourished female. No acute distress HEENT: Eyes: no retinal hemorrhage or exudates,  Neck supple, trachea midline, no carotid bruits, no thyroidmegaly Lungs: Clear to auscultation, no rhonchi or wheezes, or rib retractions  Heart: Regular rate and rhythm, no murmurs or gallops Breast:Examined in sitting and supine position were symmetrical in appearance, no palpable masses or tenderness,  no skin retraction, no nipple inversion, no nipple discharge, no skin discoloration, no axillary or supraclavicular lymphadenopathy Abdomen: no palpable masses or tenderness, no rebound or guarding Extremities: no edema or skin discoloration or tenderness  Pelvic: Vulva: Normal             Vagina: No gross lesions or discharge  Cervix: No gross lesions or discharge.    Uterus  AV, normal size, shape and consistency, non-tender and mobile  Adnexa  Without masses or tenderness  Anus: Normal   Assessment/Plan:  48 y.o. female for annual exam   1. Well woman exam with routine gynecological exam Normal gynecologic exam.  Pap negative, HPV HR neg 10/2016, no indication to repeat Pap this year.  Breast exam normal.  Last screening mammogram in December 2019 was negative.  Health labs with family physician.  Good body mass index at 21.56.  Healthy nutrition and good fitness.  2. Secondary oligomenorrhea Secondary oligomenorrhea probably associated with perimenopause at age 89.  Decision to start on cyclic Provera 10 mg daily for 10 days every 3 months if no spontaneous menstrual period.  Importance of preventing endometrial hyperplasia discussed with patient.  Patient had difficulty conceiving with now 50-year-old son.  Declines  contraception in spite of understanding that the risk of conception is low but present.  3. Perimenopausal As above.  Vitamin D supplements with calcium intake of 1.2 to 1.5 g/day including nutritional and supplemental recommended.  Regular weightbearing physical activities recommended.  Other orders - medroxyPROGESTERone (PROVERA) 10 MG tablet; Take one tablet by mouth daily for 10 days ( repeat every 3 months if no spontaneous menstrual period)  [name], 9:45 AM 03/24/2018

## 2018-03-28 ENCOUNTER — Encounter: Payer: Self-pay | Admitting: Obstetrics & Gynecology

## 2018-03-28 NOTE — Patient Instructions (Signed)
1. Well woman exam with routine gynecological exam Normal gynecologic exam.  Pap negative, HPV HR neg 10/2016, no indication to repeat Pap this year.  Breast exam normal.  Last screening mammogram in December 2019 was negative.  Health labs with family physician.  Good body mass index at 21.56.  Healthy nutrition and good fitness.  2. Secondary oligomenorrhea Secondary oligomenorrhea probably associated with perimenopause at age 48.  Decision to start on cyclic Provera 10 mg daily for 10 days every 3 months if no spontaneous menstrual period.  Importance of preventing endometrial hyperplasia discussed with patient.  Patient had difficulty conceiving with now 74-year-old son.  Declines contraception in spite of understanding that the risk of conception is low but present.  3. Perimenopausal As above.  Vitamin D supplements with calcium intake of 1.2 to 1.5 g/day including nutritional and supplemental recommended.  Regular weightbearing physical activities recommended.  Other orders - medroxyPROGESTERone (PROVERA) 10 MG tablet; Take one tablet by mouth daily for 10 days ( repeat every 3 months if no spontaneous menstrual period)  Maralyn Sago, as always, a pleasure seeing you!

## 2018-04-02 ENCOUNTER — Encounter: Payer: BLUE CROSS/BLUE SHIELD | Admitting: Obstetrics & Gynecology

## 2018-04-09 ENCOUNTER — Encounter: Payer: BLUE CROSS/BLUE SHIELD | Admitting: Obstetrics & Gynecology

## 2018-12-11 DIAGNOSIS — Z0000 Encounter for general adult medical examination without abnormal findings: Secondary | ICD-10-CM | POA: Diagnosis not present

## 2018-12-18 ENCOUNTER — Ambulatory Visit: Payer: BC Managed Care – PPO | Admitting: Pediatrics

## 2018-12-19 DIAGNOSIS — Z1159 Encounter for screening for other viral diseases: Secondary | ICD-10-CM | POA: Diagnosis not present

## 2018-12-23 DIAGNOSIS — Z800 Family history of malignant neoplasm of digestive organs: Secondary | ICD-10-CM | POA: Diagnosis not present

## 2018-12-23 DIAGNOSIS — Z1211 Encounter for screening for malignant neoplasm of colon: Secondary | ICD-10-CM | POA: Diagnosis not present

## 2019-01-06 ENCOUNTER — Telehealth: Payer: Self-pay

## 2019-01-06 ENCOUNTER — Other Ambulatory Visit: Payer: Self-pay | Admitting: Obstetrics & Gynecology

## 2019-01-06 ENCOUNTER — Other Ambulatory Visit: Payer: Self-pay

## 2019-01-06 ENCOUNTER — Encounter: Payer: Self-pay | Admitting: Sports Medicine

## 2019-01-06 ENCOUNTER — Ambulatory Visit (INDEPENDENT_AMBULATORY_CARE_PROVIDER_SITE_OTHER): Payer: BC Managed Care – PPO | Admitting: Sports Medicine

## 2019-01-06 VITALS — BP 114/78 | Ht 64.0 in | Wt 125.0 lb

## 2019-01-06 DIAGNOSIS — Z1231 Encounter for screening mammogram for malignant neoplasm of breast: Secondary | ICD-10-CM

## 2019-01-06 DIAGNOSIS — M79671 Pain in right foot: Secondary | ICD-10-CM

## 2019-01-06 NOTE — Progress Notes (Signed)
PCP: Glenis Smoker, MD  Subjective:   HPI: Patient is a 48 y.o. female here for evaluation of right arch pain.  Patient notes the pain is been present for the last several years.  The pain is located in the mid substance of her right arch.  The pain does not radiate.  She denies any injury or trauma that led to the pain.  She denies any bruising or swelling.  She has no numbness or tingling.  Patient has been seeing a podiatrist for her pain and has received many different treatments including night splints, stretching and icing, multiple plantar fascia injections, and custom orthotics.  Patient notes none of these interventions have improved her symptoms.  She was advised that she would likely need surgery for her foot but wanted a second opinion before getting this referral done.  Patient notes her pain has a sharp stabbing quality to it.  Her current pain is a 3 out of 10.   Review of Systems: See HPI above.  Past Medical History:  Diagnosis Date  . Abnormal Pap smear of cervix   . AMA (advanced maternal age) multigravida 24+   . Gonadotropin resistant ovary syndrome   . Hx of Bell's palsy    several months ago  . Hypothyroidism   . Menstrual migraine 08/11/2015  . Migraines   . No pertinent past medical history   . PONV (postoperative nausea and vomiting)     Current Outpatient Medications on File Prior to Visit  Medication Sig Dispense Refill  . aspirin-acetaminophen-caffeine (EXCEDRIN MIGRAINE) 250-250-65 MG tablet Take by mouth every 8 (eight) hours as needed for headache.    . Cholecalciferol (VITAMIN D) 2000 units CAPS Take by mouth daily.    . medroxyPROGESTERone (PROVERA) 10 MG tablet Take one tablet by mouth daily for 10 days ( repeat every 3 months if no spontaneous menstrual period) (Patient not taking: Reported on 01/06/2019) 10 tablet 4   No current facility-administered medications on file prior to visit.     Past Surgical History:  Procedure Laterality Date   . DILATION AND EVACUATION  01/08/2011   Procedure: DILATATION AND EVACUATION (D&E) 2ND TRIMESTER;  Surgeon: [name];  Location: [facility];  Service: Gynecology;  Laterality: N/A;  . GYNECOLOGIC CRYOSURGERY    . WISDOM TOOTH EXTRACTION      No Known Allergies  Social History   Socioeconomic History  . Marital status: Married    Spouse name: Not on file  . Number of children: 1  . Years of education: 40  . Highest education level: Not on file  Occupational History    Employer: [employer]  . Financial resource strain: Not on file  . Food insecurity    Worry: Not on file    Inability: Not on file  . Transportation needs    Medical: Not on file    Non-medical: Not on file  Tobacco Use  . Smoking status: Never Smoker  . Smokeless tobacco: Never Used  Substance and Sexual Activity  . Alcohol use: Yes    Alcohol/week: 0.0 standard drinks    Comment: 2 drinks a week   . Drug use: No  . Sexual activity: Yes    Partners: Male    Birth control/protection: None    Comment: 1st intercourse- 60, partners- 37, married- 15 yrs   Lifestyle  . Physical activity    Days per week: Not on file    Minutes per session: Not on file  .  Stress: Not on file  Relationships  . Social Musician on phone: Not on file    Gets together: Not on file    Attends religious service: Not on file    Active member of club or organization: Not on file    Attends meetings of clubs or organizations: Not on file    Relationship status: Not on file  . Intimate partner violence    Fear of current or ex partner: Not on file    Emotionally abused: Not on file    Physically abused: Not on file    Forced sexual activity: Not on file  Other Topics Concern  . Not on file  Social History Narrative   Lives at home w/ her spouse and child   Right-handed   Drinks a cup of coffee each morning    Family History  Problem Relation Age of Onset  . Osteopenia Mother   .  Asthma Father   . Graves' disease Sister   . Migraines Neg Hx   . Breast cancer Neg Hx         Objective:  Physical Exam: BP 114/78   Ht 5\' 4"  (1.626 m)   Wt 125 lb (56.7 kg)   BMI 21.46 kg/m  Gen: NAD, comfortable in exam room Lungs: Breathing comfortably on room air Ankle/Foot Exam Right -Inspection: Pes cavus deformity with calcaneal varus -Palpation: Tenderness palpation at the midpoint of the arch.  No tenderness palpation at the plantar fascial insertion.  No tenderness palpation of the forefoot. -ROM: Normal ROM with dorsiflexion, plantarflexion, inversion, eversion -Strength: Dorsiflexion: 5/5; Plantarflexion: 5/5; Inversion: 5/5; Eversion: 5/5 -Special Tests: Anterior drawer: negative; Talar tilt: Negative; Calcaneal squeeze: Negative; Tib/fib: Negative -Limb neurovascularly intact, no instability noted  Contralateral Ankle -Inspection: Pes cavus deformity with calcaneal varus.  Small corn noted along the plantar aspect of her forefoot -Palpation: No tenderness palpation -ROM: Normal ROM with dorsiflexion, plantarflexion, inversion, eversion -Strength: Dorsiflexion: 5/5; Plantarflexion: 5/5; Inversion: 5/5; Eversion: 5/5 -Limb neurovascularly intact, no instability noted  -Gait: Supinated gait    Assessment & Plan:  Patient is a 48 y.o. female here for right foot pain  1.  Recurrent arch strain of her right foot -Patient does not have classic plantar fasciitis.  She was advised that she may stop using the night splints as this is unlikely to be beneficial for her -Patient's current orthotics are very rigid without much cushioning.  We discussed making a second set of orthotics to provide additional cushioning and arch support to see if this helps improve her pain -The patient does not have improvement with orthotics will consider an MRI of her right foot in the future  Patient will return in 1 week to have custom orthotics made  Patient seen and evaluated with  the sports medicine fellow.  I agree with the above plan of care.  Patient's current orthotics do not provide enough arch support.  She will return to the office next week for a new pair of custom orthotics.  We will reevaluate her gait at that time as well.  We may need to add a lateral heel wedge for her supination.  She will then return to the office 4 weeks later for reevaluation.  Given the chronicity of her symptoms I would consider further diagnostic imaging if no improvement.

## 2019-01-06 NOTE — Telephone Encounter (Signed)
Called c/o breast tenderness since Saturday on one side of one breast and wants to discuss.  I listened to her describe it and then recommend office visit for breast exam. Appt scheduled.

## 2019-01-07 ENCOUNTER — Ambulatory Visit: Payer: BLUE CROSS/BLUE SHIELD | Admitting: Obstetrics & Gynecology

## 2019-01-13 ENCOUNTER — Other Ambulatory Visit: Payer: Self-pay

## 2019-01-13 ENCOUNTER — Ambulatory Visit (INDEPENDENT_AMBULATORY_CARE_PROVIDER_SITE_OTHER): Payer: BC Managed Care – PPO | Admitting: Sports Medicine

## 2019-01-13 ENCOUNTER — Encounter: Payer: Self-pay | Admitting: Sports Medicine

## 2019-01-13 VITALS — BP 122/80 | Ht 64.0 in | Wt 125.0 lb

## 2019-01-13 DIAGNOSIS — Q6672 Congenital pes cavus, left foot: Secondary | ICD-10-CM | POA: Diagnosis not present

## 2019-01-13 DIAGNOSIS — R269 Unspecified abnormalities of gait and mobility: Secondary | ICD-10-CM

## 2019-01-13 DIAGNOSIS — Q6671 Congenital pes cavus, right foot: Secondary | ICD-10-CM

## 2019-01-13 NOTE — Progress Notes (Signed)
PCP: Shon Haleimberlake, Kathryn S, MD  Subjective:   HPI: Patient is a 48 y.o. female here for custom orthotics.  Patient was seen last week for bilateral foot pain.  She was found to have recurrent arch strains as well as pes cavus deformity of her bilateral feet.  Patient returns today to have custom orthotics made for better arch support to see if this helps improve her recurrent arch strains that she has been getting.  Please see note from 1 week ago for full details regarding patient's history.  There is been no change in patient's history since her last visit 1 week ago.   Review of Systems: See HPI above.  Past Medical History:  Diagnosis Date  . Abnormal Pap smear of cervix   . AMA (advanced maternal age) multigravida 35+   . Gonadotropin resistant ovary syndrome   . Hx of Bell's palsy    several months ago  . Hypothyroidism   . Menstrual migraine 08/11/2015  . Migraines   . No pertinent past medical history   . PONV (postoperative nausea and vomiting)     Current Outpatient Medications on File Prior to Visit  Medication Sig Dispense Refill  . aspirin-acetaminophen-caffeine (EXCEDRIN MIGRAINE) 250-250-65 MG tablet Take by mouth every 8 (eight) hours as needed for headache.    . Cholecalciferol (VITAMIN D) 2000 units CAPS Take by mouth daily.    . medroxyPROGESTERone (PROVERA) 10 MG tablet Take one tablet by mouth daily for 10 days ( repeat every 3 months if no spontaneous menstrual period) (Patient not taking: Reported on 01/06/2019) 10 tablet 4   No current facility-administered medications on file prior to visit.     Past Surgical History:  Procedure Laterality Date  . DILATION AND EVACUATION  01/08/2011   Procedure: DILATATION AND EVACUATION (D&E) 2ND TRIMESTER;  Surgeon: Genia DelMarie-Lyne Lavoie;  Location: [facility];  Service: Gynecology;  Laterality: N/A;  . GYNECOLOGIC CRYOSURGERY    . WISDOM TOOTH EXTRACTION      No Known Allergies  Social History   Socioeconomic History   . Marital status: Married    Spouse name: Not on file  . Number of children: 1  . Years of education: 5118  . Highest education level: Not on file  Occupational History    Employer: [employer]  Social Needs  . Financial resource strain: Not on file  . Food insecurity    Worry: Not on file    Inability: Not on file  . Transportation needs    Medical: Not on file    Non-medical: Not on file  Tobacco Use  . Smoking status: Never Smoker  . Smokeless tobacco: Never Used  Substance and Sexual Activity  . Alcohol use: Yes    Alcohol/week: 0.0 standard drinks    Comment: 2 drinks a week   . Drug use: No  . Sexual activity: Yes    Partners: Male    Birth control/protection: None    Comment: 1st intercourse- [age], partners- 5, married- 15 yrs   Lifestyle  . Physical activity    Days per week: Not on file    Minutes per session: Not on file  . Stress: Not on file  Relationships  . Social Musicianconnections    Talks on phone: Not on file    Gets together: Not on file    Attends religious service: Not on file    Active member of club or organization: Not on file    Attends meetings of clubs or organizations: Not  on file    Relationship status: Not on file  . Intimate partner violence    Fear of current or ex partner: Not on file    Emotionally abused: Not on file    Physically abused: Not on file    Forced sexual activity: Not on file  Other Topics Concern  . Not on file  Social History Narrative   Lives at home w/ her spouse and child   Right-handed   Drinks a cup of coffee each morning    Family History  Problem Relation Age of Onset  . Osteopenia Mother   . Asthma Father   . Graves' disease Sister   . Migraines Neg Hx   . Breast cancer Neg Hx         Objective:  Physical Exam: There were no vitals taken for this visit. Gen: NAD, comfortable in exam room Lungs: Breathing comfortably on room air Ankle/Foot Exam Bilateral -Inspection: Pes cavus deformity with  calcaneal varus -Palpation: Tenderness palpation at the midpoint of the arch.  No tenderness palpation at the plantar fascia insertion.  No tenderness palpation of the forefoot -Patient with drop of her third metatarsal head on her left foot   -Gait: Slight supination gait right greater than left    Assessment & Plan:  Patient is a 48 y.o. female here for custom orthotics  1.  Bilateral pes cavus deformity Patient was fitted for a : standard, cushioned, semi-rigid orthotic. The orthotic was heated and afterward the patient stood on the orthotic blank positioned on the orthotic stand. The patient was positioned in subtalar neutral position and 10 degrees of ankle dorsiflexion in a weight bearing stance. After completion of molding, a stable base was applied to the orthotic blank. The blank was ground to a stable position for weight bearing. Size: 6 Base: Medium density Blue EVA Posting: None Additional orthotic padding: None  -Patient still with mild supination of her right foot with walking following orthotics however patient had no supination of her left foot with walking with her orthotics in place.  Given that the supination was mild lateral heel wedge was not inserted into her orthotics today however will consider this in the future if she continues to have ongoing pain. -Given the additional padding of the orthotics that she was made today in comparison to her previous orthotics the decision was made not to provide additional padding for her left third metatarsal head.  If she continues to have pain in this area will consider adding a metatarsal pad to help unload this area  Patient will follow up in 4 to 5 weeks.  We will make further adjustments to her orthotics at that time as necessary.  If she has questions or concerns prior to this visit or needs adjustments in her orthotics before that she will call us to schedule visit   Patient seen and evaluated with the sports medicine  fellow.  I agree with the above plan of care.  Patient found her custom orthotics comfortable prior to leaving the office.  She still has a mild amount of supination but not enough to correct with a lateral heel wedge.  She has a history of metatarsalgia but currently has no metatarsal pain.  We elected to forego a metatarsal pad at this time with the understanding that we could add this at a later date if needed.  Patient will follow up with me in the office in approximately 4 weeks for reevaluation.  I had previously discussed  merits of further diagnostic imaging if orthotics are not helpful.

## 2019-01-26 ENCOUNTER — Other Ambulatory Visit: Payer: Self-pay

## 2019-01-27 ENCOUNTER — Ambulatory Visit: Payer: BC Managed Care – PPO | Admitting: Women's Health

## 2019-01-27 ENCOUNTER — Encounter: Payer: Self-pay | Admitting: Women's Health

## 2019-01-27 VITALS — BP 124/80

## 2019-01-27 DIAGNOSIS — N951 Menopausal and female climacteric states: Secondary | ICD-10-CM | POA: Diagnosis not present

## 2019-01-27 MED ORDER — ESTRADIOL 0.05 MG/24HR TD PTTW: 1.0000 | MEDICATED_PATCH | TRANSDERMAL | 4 refills | Status: DC

## 2019-01-27 MED ORDER — PROGESTERONE MICRONIZED 200 MG PO CAPS: ORAL_CAPSULE | ORAL | 4 refills | Status: DC

## 2019-01-27 NOTE — Progress Notes (Signed)
48 year old MWF G3, P1 presents with complaint of increased daily hot flashes, poor sleep for the last month and questioning need for HRT.  LMP 04/2018, irregular cycles year prior.  Had been taking Provera every 3 months if no cycle last time she took no withdrawal bleed.  Denies any vaginal discharge, urinary symptoms, abdominal/back pain, fevers.  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  No known medical problems. Exam: Appears well.  Perimenopausal with numerous menopausal symptoms  Plan: Reviewed definition of menopause no cycle for 1 year which has not happened although the cycle in March was induced with Provera.  Options reviewed nothing versus HRT would like to try.  Options of HRT reviewed cyclic progesterone with estrogen patch as the safest and would prefer.  Prometrium 200 mg at bedtime day 1 through 12 of each month, reviewed may get a withdrawal bleed, Vivelle patch  0.05 patch twice weekly, proper administration reviewed, reviewed risks of HRT as blood clots, strokes and breast cancer, women's health initiative discussed to use shortest amount of time or less than 7 years discussed.  Instructed to call if symptoms are not relieved, or no improvement in several weeks.

## 2019-01-27 NOTE — Patient Instructions (Signed)
Menopause and Hormone Replacement Therapy Menopause is a normal time of life when menstrual periods stop completely and the ovaries stop producing the female hormones estrogen and progesterone. This lack of hormones can affect your health and cause undesirable symptoms. Hormone replacement therapy (HRT) can relieve some of those symptoms. What is hormone replacement therapy? HRT is the use of artificial (synthetic) hormones to replace hormones that your body has stopped producing because you have reached menopause. What are my options for HRT?  HRT [place] consist of the synthetic hormones estrogen and progestin, or it [place] consist of only estrogen (estrogen-only therapy). You and your health care provider will decide which form of HRT is best for you. If you choose to be on HRT and you have a uterus, estrogen and progestin are usually prescribed. Estrogen-only therapy is used for women who do not have a uterus. Possible options for taking HRT include:  Pills.  Patches.  Gels.  Sprays.  Vaginal cream.  Vaginal rings.  Vaginal inserts. The amount of hormone(s) that you take and how long you take the hormone(s) varies according to your health. It is important to:  Begin HRT with the lowest possible dosage.  Stop HRT as soon as your health care provider tells you to stop.  Work with your health care provider so that you feel informed and comfortable with your decisions. What are the benefits of HRT? HRT can reduce the frequency and severity of menopausal symptoms. Benefits of HRT vary according to the kind of symptoms that you have, how severe they are, and your overall health. HRT [place] help to improve the following symptoms of menopause:  Hot flashes and night sweats. These are sudden feelings of heat that spread over the face and body. The skin [place] turn red, like a blush. Night sweats are hot flashes that happen while you are sleeping or trying to sleep.  Bone loss (osteoporosis). The  body loses calcium more quickly after menopause, causing the bones to become weaker. This can increase the risk for bone breaks (fractures).  Vaginal dryness. The lining of the vagina can become thin and dry, which can cause pain during sex or cause infection, burning, or itching.  Urinary tract infections.  Urinary incontinence. This is the inability to control when you pass urine.  Irritability.  Short-term memory problems. What are the risks of HRT? Risks of HRT vary depending on your individual health and medical history. Risks of HRT also depend on whether you receive both estrogen and progestin or you receive estrogen only. HRT [place] increase the risk of:  Spotting. This is when a small amount of blood leaks from the vagina unexpectedly.  Endometrial cancer. This cancer is in the lining of the uterus (endometrium).  Breast cancer.  Increased density of breast tissue. This can make it harder to find breast cancer on a breast X-ray (mammogram).  Stroke.  Heart disease.  Blood clots.  Gallbladder disease.  Liver disease. Risks of HRT can increase if you have any of the following conditions:  Endometrial cancer.  Liver disease.  Heart disease.  Breast cancer.  History of blood clots.  History of stroke. Follow these instructions at home:  Take over-the-counter and prescription medicines only as told by your health care provider.  Get mammograms, pelvic exams, and medical checkups as often as told by your health care provider.  Have Pap tests done as often as told by your health care provider. A Pap test is sometimes called a Pap smear. It   is a screening test that is used to check for signs of cancer of the cervix and vagina. A Pap test can also identify the presence of infection or precancerous changes. Pap tests may be done: ? Every 3 years, starting at age 59. ? Every 5 years, starting after age 24, in combination with testing for human papillomavirus  (HPV). ? More often or less often depending on other medical conditions you have, your age, and other risk factors.  It is up to you to get the results of your Pap test. Ask your health care provider, or the department that is doing the test, when your results will be ready.  Keep all follow-up visits as told by your health care provider. This is important. Contact a health care provider if you have:  Pain or swelling in your legs.  Shortness of breath.  Chest pain.  Lumps or changes in your breasts or armpits.  Slurred speech.  Pain, burning, or bleeding when you urinate.  Unusual vaginal bleeding.  Dizziness or headaches.  Weakness or numbness in any part of your arms or legs.  Pain in your abdomen. Summary  Menopause is a normal time of life when menstrual periods stop completely and the ovaries stop producing the female hormones estrogen and progesterone.  Hormone replacement therapy (HRT) can relieve some of the symptoms of menopause.  HRT can reduce the frequency and severity of menopausal symptoms.  Risks of HRT vary depending on your individual health and medical history. This information is not intended to replace advice given to you by your health care provider. Make sure you discuss any questions you have with your health care provider. Document Released: 10/28/2002 Document Revised: 10/01/2017 Document Reviewed: 10/01/2017 Elsevier Patient Education  2020 [place]. Menopause and Hormone Replacement Therapy Menopause is a normal time of life when menstrual periods stop completely and the ovaries stop producing the female hormones estrogen and progesterone. This lack of hormones can affect your health and cause undesirable symptoms. Hormone replacement therapy (HRT) can relieve some of those symptoms. What is hormone replacement therapy? HRT is the use of artificial (synthetic) hormones to replace hormones that your body has stopped producing because you have  reached menopause. What are my options for HRT?  HRT may consist of the synthetic hormones estrogen and progestin, or it may consist of only estrogen (estrogen-only therapy). You and your health care provider will decide which form of HRT is best for you. If you choose to be on HRT and you have a uterus, estrogen and progestin are usually prescribed. Estrogen-only therapy is used for women who do not have a uterus. Possible options for taking HRT include:  Pills.  Patches.  Gels.  Sprays.  Vaginal cream.  Vaginal rings.  Vaginal inserts. The amount of hormone(s) that you take and how long you take the hormone(s) varies according to your health. It is important to:  Begin HRT with the lowest possible dosage.  Stop HRT as soon as your health care provider tells you to stop.  Work with your health care provider so that you feel informed and comfortable with your decisions. What are the benefits of HRT? HRT can reduce the frequency and severity of menopausal symptoms. Benefits of HRT vary according to the kind of symptoms that you have, how severe they are, and your overall health. HRT may help to improve the following symptoms of menopause:  Hot flashes and night sweats. These are sudden feelings of heat that spread over the  face and body. The skin may turn red, like a blush. Night sweats are hot flashes that happen while you are sleeping or trying to sleep.  Bone loss (osteoporosis). The body loses calcium more quickly after menopause, causing the bones to become weaker. This can increase the risk for bone breaks (fractures).  Vaginal dryness. The lining of the vagina can become thin and dry, which can cause pain during sex or cause infection, burning, or itching.  Urinary tract infections.  Urinary incontinence. This is the inability to control when you pass urine.  Irritability.  Short-term memory problems. What are the risks of HRT? Risks of HRT vary depending on your  individual health and medical history. Risks of HRT also depend on whether you receive both estrogen and progestin or you receive estrogen only. HRT may increase the risk of:  Spotting. This is when a small amount of blood leaks from the vagina unexpectedly.  Endometrial cancer. This cancer is in the lining of the uterus (endometrium).  Breast cancer.  Increased density of breast tissue. This can make it harder to find breast cancer on a breast X-ray (mammogram).  Stroke.  Heart disease.  Blood clots.  Gallbladder disease.  Liver disease. Risks of HRT can increase if you have any of the following conditions:  Endometrial cancer.  Liver disease.  Heart disease.  Breast cancer.  History of blood clots.  History of stroke. Follow these instructions at home:  Take over-the-counter and prescription medicines only as told by your health care provider.  Get mammograms, pelvic exams, and medical checkups as often as told by your health care provider.  Have Pap tests done as often as told by your health care provider. A Pap test is sometimes called a Pap smear. It is a screening test that is used to check for signs of cancer of the cervix and vagina. A Pap test can also identify the presence of infection or precancerous changes. Pap tests may be done: ? Every 3 years, starting at age 44. ? Every 5 years, starting after age 69, in combination with testing for human papillomavirus (HPV). ? More often or less often depending on other medical conditions you have, your age, and other risk factors.  It is up to you to get the results of your Pap test. Ask your health care provider, or the department that is doing the test, when your results will be ready.  Keep all follow-up visits as told by your health care provider. This is important. Contact a health care provider if you have:  Pain or swelling in your legs.  Shortness of breath.  Chest pain.  Lumps or changes in your  breasts or armpits.  Slurred speech.  Pain, burning, or bleeding when you urinate.  Unusual vaginal bleeding.  Dizziness or headaches.  Weakness or numbness in any part of your arms or legs.  Pain in your abdomen. Summary  Menopause is a normal time of life when menstrual periods stop completely and the ovaries stop producing the female hormones estrogen and progesterone.  Hormone replacement therapy (HRT) can relieve some of the symptoms of menopause.  HRT can reduce the frequency and severity of menopausal symptoms.  Risks of HRT vary depending on your individual health and medical history. This information is not intended to replace advice given to you by your health care provider. Make sure you discuss any questions you have with your health care provider. Document Released: 10/28/2002 Document Revised: 10/01/2017 Document Reviewed: 10/01/2017 Elsevier Patient  Education  2020 Elsevier Inc.  

## 2019-02-17 ENCOUNTER — Ambulatory Visit: Payer: BC Managed Care – PPO | Admitting: Sports Medicine

## 2019-02-25 ENCOUNTER — Ambulatory Visit
Admission: RE | Admit: 2019-02-25 | Discharge: 2019-02-25 | Disposition: A | Discharge status: Home / Self Care | Payer: BC Managed Care – PPO | Source: Ambulatory Visit | Attending: Obstetrics & Gynecology | Admitting: Obstetrics & Gynecology

## 2019-02-25 ENCOUNTER — Other Ambulatory Visit: Payer: Self-pay

## 2019-02-25 DIAGNOSIS — Z1231 Encounter for screening mammogram for malignant neoplasm of breast: Secondary | ICD-10-CM

## 2019-03-27 ENCOUNTER — Other Ambulatory Visit: Payer: Self-pay

## 2019-03-30 ENCOUNTER — Other Ambulatory Visit: Payer: Self-pay

## 2019-03-30 ENCOUNTER — Encounter: Payer: Self-pay | Admitting: Obstetrics & Gynecology

## 2019-03-30 ENCOUNTER — Ambulatory Visit (INDEPENDENT_AMBULATORY_CARE_PROVIDER_SITE_OTHER): Payer: BC Managed Care – PPO | Admitting: Obstetrics & Gynecology

## 2019-03-30 VITALS — BP 120/80 | Ht 63.5 in | Wt 131.0 lb

## 2019-03-30 DIAGNOSIS — Z01419 Encounter for gynecological examination (general) (routine) without abnormal findings: Secondary | ICD-10-CM

## 2019-03-30 DIAGNOSIS — Z79890 Hormone replacement therapy (postmenopausal): Secondary | ICD-10-CM | POA: Diagnosis not present

## 2019-03-30 DIAGNOSIS — N951 Menopausal and female climacteric states: Secondary | ICD-10-CM | POA: Diagnosis not present

## 2019-03-30 MED ORDER — PROGESTERONE MICRONIZED 200 MG PO CAPS: ORAL_CAPSULE | ORAL | 4 refills | Status: DC

## 2019-03-30 MED ORDER — ESTRADIOL 0.05 MG/24HR TD PTTW: 1.0000 | MEDICATED_PATCH | TRANSDERMAL | 4 refills | Status: DC

## 2019-03-30 NOTE — Patient Instructions (Signed)
1. Encounter for routine gynecological examination with Papanicolaou smear of cervix Normal gynecologic exam in menopause.  Pap reflex done.  Breast exam normal.  Screening mammogram January 2021 was negative.  Colonoscopy fall 2020 normal.  Health labs with family physician.  Good body mass index at 22.84.  Continue with fitness and healthy nutrition.  2. Postmenopausal hormone replacement therapy Well on hormone replacement therapy.  No contraindication to continue with same regimen.  No abnormal postmenopausal bleeding.  Risks and benefits reviewed with patient.  Estradiol patch 0.05 biweekly represcribed.  Progesterone capsule 200 mg per mouth at bedtime from day 1-12 of the month represcribed.  Patient will call back if prefers continuous hormone replacement therapy in the future.  Taking vitamin D supplements.  Recommend calcium intake of 1200 mg daily including food and supplements.  Regular weightbearing physical activity is recommended.  3. Menopausal symptoms - estradiol (VIVELLE-DOT) 0.05 MG/24HR patch; Place 1 patch (0.05 mg total) onto the skin 2 (two) times a week. - progesterone (PROMETRIUM) 200 MG capsule; Take daily 1-12 of each month at bedtime  Other orders - Ascorbic Acid (VITAMIN C) 1000 MG tablet - Zinc 15 MG CAPS - vitamin A 3 MG (10000 UNITS) capsule  Willodean, it was a pleasure seeing you today!  I will inform you of your results as soon as they are available.

## 2019-03-30 NOTE — Addendum Note (Signed)
Addended by: Vena Austria T on: 03/30/2019 09:08 AM   Modules accepted: Orders

## 2019-03-30 NOTE — Progress Notes (Signed)
Tiffany Webb 08-Jan-1971 [id number]   History:    49 y.o. G3P1A2L1 Married.  Son is 32 yo.  RP:  Established patient presenting for annual gyn exam   HPI: Oligomenorrhea with LMP 04/2018.  Started on HRT 01/2019 with Prometrium 200 mg HS day 1-12th of the month and Estradiol patch 0.05 biweekly.  Improved hot flushes or night sweats.  No PMB.  No pelvic pain.  No pain with IC.  Breasts normal.  BMI 22.84.  Physically active.  Health labs with Fam MD.  [name] normal Fall 2020.  Past medical history,surgical history, family history and social history were all reviewed and documented in the EPIC chart.  Gynecologic History Patient's last menstrual period was 03/27/2019.  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  GENERAL: No fevers or chills. HEENT: No change in vision, no earache, sore throat or sinus congestion. NECK: No pain or stiffness. CARDIOVASCULAR: No chest pain or pressure. No palpitations. PULMONARY: No shortness of breath, cough or wheeze. GASTROINTESTINAL: No abdominal pain, nausea, vomiting or diarrhea, melena or bright red blood per rectum. GENITOURINARY: No urinary frequency, urgency, hesitancy or dysuria. MUSCULOSKELETAL: No joint or muscle pain, no back pain, no recent trauma. DERMATOLOGIC: No rash, no itching, no lesions. ENDOCRINE: No polyuria, polydipsia, no heat or cold intolerance. No recent change in weight. HEMATOLOGICAL: No anemia or easy bruising or bleeding. NEUROLOGIC: No headache, seizures, numbness, tingling or weakness. PSYCHIATRIC: No depression, no  loss of interest in normal activity or change in sleep pattern.     Exam:   BP 120/80 (BP Location: Right Arm, Patient Position: Sitting, Cuff Size: Normal)   Ht 5' 3.5" (1.613 m)   Wt 131 lb (59.4 kg)   LMP 03/27/2019   Breastfeeding No   BMI 22.84 kg/m   Body mass index is 22.84 kg/m.  General appearance : Well developed well nourished female. No acute distress HEENT: Eyes: no retinal hemorrhage or exudates,  Neck supple, trachea midline, no carotid bruits, no thyroidmegaly Lungs: Clear to auscultation, no rhonchi or wheezes, or rib retractions  Heart: Regular rate and rhythm, no murmurs or gallops Breast:Examined in sitting and supine position were symmetrical in appearance, no palpable masses or tenderness,  no skin retraction, no nipple inversion, no nipple discharge, no skin discoloration, no axillary or supraclavicular lymphadenopathy Abdomen: no palpable masses or tenderness, no rebound or guarding Extremities: no edema or skin discoloration or tenderness  Pelvic: Vulva: Normal             Vagina: No gross lesions or discharge  Cervix: No gross lesions or discharge. Pap reflex done.  Uterus  AV, normal size, shape and consistency, non-tender and mobile  Adnexa  Without masses or tenderness  Anus: Normal   Assessment/Plan:  49 y.o. female for annual exam   1. Encounter for routine gynecological examination with Papanicolaou  smear of cervix Normal gynecologic exam in menopause.  Pap reflex done.  Breast exam normal.  Screening mammogram January 2021 was negative.  Colonoscopy fall 2020 normal.  Health labs with family physician.  Good body mass index at 22.84.  Continue with fitness and healthy nutrition.  2. Postmenopausal hormone replacement therapy Well on hormone replacement therapy.  No contraindication to continue with same regimen.  No abnormal postmenopausal bleeding.  Risks and benefits reviewed with patient.  Estradiol patch 0.05 biweekly represcribed.   Progesterone capsule 200 mg per mouth at bedtime from day 1-12 of the month represcribed.  Patient will call back if prefers continuous hormone replacement therapy in the future.  Taking vitamin D supplements.  Recommend calcium intake of 1200 mg daily including food and supplements.  Regular weightbearing physical activity is recommended.  3. Menopausal symptoms - estradiol (VIVELLE-DOT) 0.05 MG/24HR patch; Place 1 patch (0.05 mg total) onto the skin 2 (two) times a week. - progesterone (PROMETRIUM) 200 MG capsule; Take daily 1-12 of each month at bedtime  Other orders - Ascorbic Acid (VITAMIN C) 1000 MG tablet - Zinc 15 MG CAPS - vitamin A 3 MG (10000 UNITS) capsule  [name] MD, 8:21 AM 03/30/2019

## 2019-04-01 LAB — PAP IG W/ RFLX HPV ASCU

## 2019-04-10 ENCOUNTER — Telehealth: Payer: Self-pay | Admitting: *Deleted

## 2019-04-10 MED ORDER — PROGESTERONE MICRONIZED 100 MG PO CAPS: 100.0000 mg | ORAL_CAPSULE | Freq: Every day | ORAL | 4 refills | Status: DC

## 2019-04-10 NOTE — Telephone Encounter (Signed)
Agree with Prometrium 100 mg daily HS.  #90, refill x 4.

## 2019-04-10 NOTE — Telephone Encounter (Signed)
Patient takes vivelle dot patch twice weekly and progesterone 200 mg capsules day 1-12 monthly. Called today requesting a Rx for progesterone to take daily. Please advise

## 2019-04-10 NOTE — Telephone Encounter (Signed)
Patient informed, Rx sent.  

## 2019-04-13 ENCOUNTER — Encounter: Payer: Self-pay | Admitting: Obstetrics & Gynecology

## 2019-04-13 ENCOUNTER — Telehealth: Payer: Self-pay | Admitting: *Deleted

## 2019-04-13 DIAGNOSIS — N951 Menopausal and female climacteric states: Secondary | ICD-10-CM

## 2019-04-13 MED ORDER — PROGESTERONE MICRONIZED 100 MG PO CAPS: 100.0000 mg | ORAL_CAPSULE | Freq: Every day | ORAL | 4 refills | Status: DC

## 2019-04-13 MED ORDER — ESTRADIOL 0.05 MG/24HR TD PTTW: 1.0000 | MEDICATED_PATCH | TRANSDERMAL | 4 refills | Status: DC

## 2019-04-13 NOTE — Telephone Encounter (Signed)
Patient called requesting vivelle dot patch 0.05mg  and progesterone 100 mg send to [facility]. Rx's sent.

## 2019-06-19 ENCOUNTER — Telehealth: Payer: Self-pay | Admitting: *Deleted

## 2019-06-19 NOTE — Telephone Encounter (Signed)
Patient informed. 

## 2019-06-19 NOTE — Telephone Encounter (Signed)
Patient called uses vivelle dot patch 0.05 mg and progesterone 100 mg cap night at bedtime. Patient said missed a progesterone pill at bedtime so she took the "missed" next morning and took another progesterone pill that night. On Wednesday this week she started spotting, then Thursday and today a light-moderate flow. Was told to call if any bleeding.  Please advise

## 2019-06-19 NOTE — Telephone Encounter (Signed)
GIven the delayed Progesterone pill, will observe.  If recurrence of bleeding in the next month, schedule a pelvic US.

## 2019-07-28 ENCOUNTER — Telehealth: Payer: Self-pay | Admitting: *Deleted

## 2019-07-28 DIAGNOSIS — N950 Postmenopausal bleeding: Secondary | ICD-10-CM

## 2019-07-28 NOTE — Telephone Encounter (Signed)
Patient called c/o bleeding again she skipped another progesterone pill this month. Patient called last month c/o missed pills and had spotting last month as well and your recommendation was to schedule ultrasound if this should happen again. I explained your recommendations for patient, she is wondering if her missing the progesterone could be the cause of spotting? I explained no way to tell 100%, however this did happen last month. I offered to schedule ultrasound, however patient would like to know what you recommend she do? Please advise

## 2019-07-30 NOTE — Telephone Encounter (Signed)
Please schedule Pelvic US, I recommend investigating.

## 2019-07-30 NOTE — Telephone Encounter (Signed)
Patient informed, transferred to appointment.

## 2019-08-11 ENCOUNTER — Other Ambulatory Visit: Payer: Self-pay

## 2019-08-12 ENCOUNTER — Ambulatory Visit (INDEPENDENT_AMBULATORY_CARE_PROVIDER_SITE_OTHER): Payer: 59 | Admitting: Obstetrics & Gynecology

## 2019-08-12 ENCOUNTER — Ambulatory Visit (INDEPENDENT_AMBULATORY_CARE_PROVIDER_SITE_OTHER): Payer: 59

## 2019-08-12 ENCOUNTER — Encounter: Payer: Self-pay | Admitting: Obstetrics & Gynecology

## 2019-08-12 VITALS — BP 120/78

## 2019-08-12 DIAGNOSIS — Z79890 Hormone replacement therapy (postmenopausal): Secondary | ICD-10-CM | POA: Diagnosis not present

## 2019-08-12 DIAGNOSIS — N950 Postmenopausal bleeding: Secondary | ICD-10-CM

## 2019-08-12 DIAGNOSIS — N854 Malposition of uterus: Secondary | ICD-10-CM | POA: Diagnosis not present

## 2019-08-12 MED ORDER — ESTRADIOL 0.0375 MG/24HR TD PTTW: 1.0000 | MEDICATED_PATCH | TRANSDERMAL | 4 refills | Status: DC

## 2019-08-12 NOTE — Progress Notes (Signed)
    Tiffany Webb 1970/11/17 [id number]        49 y.o.  [id number]   RP: PMB on HRT for Pelvic US  HPI: Had 2 episodes of PMB at 5 wks intervals after forgetting to take the Prometrium capsule.  Patient is on the Estradiol patch 0.05 twice weekly and Prometrium 100 mg HS.  No pelvic pain.  No abnormal vaginal d/c.  No fever.  Urine/BMs normal.   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   Directed ROS with pertinent positives and negatives documented in the history of present illness/assessment and plan.  Exam:  Vitals:   08/12/19 1051  BP: 120/78   General appearance:  Normal  Pelvic 08/14/19 today: C/V images.  Retroverted uterus measured at 7.77 x 4.68 x 3.4 cm.  Normal in size and shape with no myometrial mass.  Thin endometrial lining, symmetrical with no mass or thickening seen measured at 3.63 mm.  Both ovaries appear normal and atrophic.  No adnexal mass.  No free fluid in the posterior cul-de-sac.   Assessment/Plan:  49 y.o. G3P1021   1. Postmenopausal hormone replacement therapy On Estradiol 0.05 patch twice a week and Prometrium 100 mg HS.  Decision to wean to Estradiol 0.0375 twice a week.  Continue Prometrium the same.  2. Postmenopausal bleeding PMB on HRT.  Pelvic 44 findings reviewed thoroughly with patient.  Patient reassured that the endometrial lining is normal and thin at 3.63 mm.  No evidence of endometrial pathology.  Will observe on lower dosage of estradiol patch.  Other orders - estradiol  (VIVELLE-DOT) 0.0375 MG/24HR; Place 1 patch onto the skin 2 (two) times a week.  [place] MD, 11:14 AM 08/12/2019

## 2019-08-15 ENCOUNTER — Encounter: Payer: Self-pay | Admitting: Obstetrics & Gynecology

## 2019-08-15 NOTE — Patient Instructions (Signed)
1. Postmenopausal hormone replacement therapy On Estradiol 0.05 patch twice a week and Prometrium 100 mg HS.  Decision to wean to Estradiol 0.0375 twice a week.  Continue Prometrium the same.  2. Postmenopausal bleeding PMB on HRT.  Pelvic US findings reviewed thoroughly with patient.  Patient reassured that the endometrial lining is normal and thin at 3.63 mm.  No evidence of endometrial pathology.  Will observe on lower dosage of estradiol patch.  Other orders - estradiol (VIVELLE-DOT) 0.0375 MG/24HR; Place 1 patch onto the skin 2 (two) times a week.  Tiffany Webb, it was a pleasure seeing you today!

## 2019-08-20 ENCOUNTER — Telehealth: Payer: Self-pay | Admitting: *Deleted

## 2019-08-20 NOTE — Telephone Encounter (Signed)
Patient called to follow up from OV on 08/12/19 estradiol switched to pill form to vivelle dot patch 0.0375 mg biweekly. Patient said she switch to patch last Friday (08/14/19) and noticed yesterday am bleeding, bright red, not heavy. She is taking the progesterone as well. Patient reports she was informed bleeding may occur due to the switch of HRT. She asked when should she be concerned about bleeding if this should happen in the future?

## 2019-08-25 NOTE — Telephone Encounter (Signed)
Patient informed. 

## 2019-08-25 NOTE — Telephone Encounter (Signed)
Schedule a Pelvic US if bleeding still happening in 3 months.

## 2019-09-22 ENCOUNTER — Telehealth: Payer: Self-pay | Admitting: *Deleted

## 2019-09-22 DIAGNOSIS — N950 Postmenopausal bleeding: Secondary | ICD-10-CM

## 2019-09-22 NOTE — Telephone Encounter (Signed)
Yes, stop Estradiol patch and Progesterone.  Schedule a Pelvic US in 3 weeks to assess the Endometrial lining.

## 2019-09-22 NOTE — Telephone Encounter (Signed)
Pt called regarding vaginal bleeding. Onset x 5 days.Pt would like a explaination on why she's bleeling and if she needs to stop the estradiol patch and progesterone. Will route to Dr. Seymour Bars for recommendations

## 2019-09-23 NOTE — Addendum Note (Signed)
Addended by: Tito Dine on: 09/23/2019 08:19 AM   Modules accepted: Orders

## 2019-10-02 NOTE — Telephone Encounter (Signed)
Pt aware of recomendations ?

## 2019-10-06 ENCOUNTER — Telehealth: Payer: Self-pay | Admitting: *Deleted

## 2019-10-06 NOTE — Telephone Encounter (Signed)
Left message for patient

## 2019-10-06 NOTE — Telephone Encounter (Addendum)
Patient called scheduled for ultrasound on 10/15/19 due to PMB, stopped HRT (vivelle dot patch and progesterone) now since 09/22/19. Had normal pelvic ultrasound on 08/12/19. Patient said she feels fine now without HRT, no menopausal symptoms yet and no bleeding. I did explained to her why you ordered the ultrasound to assess the Endometrial lining. She asked since ultrasound was done in June and "normal" should this scheduled ultrasound be schedule further out? Please advise

## 2019-10-06 NOTE — Telephone Encounter (Signed)
Can schedule a Pelvic US in 01/2020 and do it only if has PMB off of HRT.

## 2019-10-06 NOTE — Telephone Encounter (Signed)
Patient informed,appointments canceled.

## 2019-10-15 ENCOUNTER — Other Ambulatory Visit: Payer: 59

## 2019-10-15 ENCOUNTER — Ambulatory Visit: Payer: 59 | Admitting: Obstetrics & Gynecology

## 2020-01-05 ENCOUNTER — Other Ambulatory Visit: Payer: Self-pay

## 2020-01-05 ENCOUNTER — Ambulatory Visit: Payer: 59 | Attending: Family Medicine | Admitting: Physical Therapy

## 2020-01-05 ENCOUNTER — Encounter: Payer: Self-pay | Admitting: Physical Therapy

## 2020-01-05 DIAGNOSIS — M542 Cervicalgia: Secondary | ICD-10-CM | POA: Insufficient documentation

## 2020-01-05 DIAGNOSIS — G4486 Cervicogenic headache: Secondary | ICD-10-CM | POA: Insufficient documentation

## 2020-01-05 NOTE — Therapy (Signed)
[facility] [address] [place], [place], [address] Phone: [phone]   Fax:  [phone]  Physical Therapy Evaluation  Patient Details  Name: Tiffany Webb MRN: [id number] Date of Birth: Jan 25, 1971 Referring Provider (PT): Dr Garth Bigness    Encounter Date: 01/05/2020   PT End of Session - 01/05/20 1754    Visit Number 1    Date for PT Re-Evaluation 03/29/20    Authorization Type [employer]    PT Start Time 1150    PT Stop Time 1229    PT Time Calculation (min) 39 min    Activity Tolerance Patient tolerated treatment well           Past Medical History:  Diagnosis Date  . Abnormal Pap smear of cervix   . AMA (advanced maternal age) multigravida 35+   . Gonadotropin resistant ovary syndrome   . Hx of Bell's palsy    several months ago  . Hypothyroidism   . Menstrual migraine 08/11/2015  . Migraines   . No pertinent past medical history   . PONV (postoperative nausea and vomiting)     Past Surgical History:  Procedure Laterality Date  . DILATION AND EVACUATION  01/08/2011   Procedure: DILATATION AND EVACUATION (D&E) 2ND TRIMESTER;  Surgeon: Genia Del;  Location: [facility];  Service: Gynecology;  Laterality: N/A;  . GYNECOLOGIC CRYOSURGERY    . WISDOM TOOTH EXTRACTION      There were no vitals filed for this visit.    Subjective Assessment - 01/05/20 1152    Subjective Headaches for a long time but changed now;  suspect tension related;  Right lateral neck pain in the mornings and right frontal headache.  Better as the day goes on    Pertinent History headache freq variable but approx 1x every 1-2 weeks    Limitations Other (comment)    Diagnostic tests osteopath said looked OK x-ray    Currently in Pain? Yes    Pain Score 2     Pain Location Neck    Pain Orientation Right    Pain Type Chronic pain    Pain Radiating Towards right frontal head    Pain Onset More than a month  ago    Pain Frequency Intermittent    Aggravating Factors  AM;  no paricular movement patterns;  wonders if accumulation of things    Pain Relieving Factors better as the day goes on              [facility] PT Assessment - 01/05/20 0001      Assessment   Medical Diagnosis cervicalgia     Referring Provider (PT) Dr Garth Bigness     Hand Dominance Right    Next MD Visit as needed    Prior Therapy 25 years ago leg       Precautions   Precautions None      Restrictions   Weight Bearing Restrictions No      Balance Screen   Has the patient fallen in the past 6 months No    Has the patient had a decrease in activity level because of a fear of falling?  No    Is the patient reluctant to leave their home because of a fear of falling?  No      Home [employer] information centre manager residence      Prior Function   Level of Independence Independent    Vocation Unemployed  Vocation Requirements at home with son    Leisure sewing; cooking; watching TV       Observation/Other Assessments   Focus on Therapeutic Outcomes (FOTO)  4%       Posture/Postural Control   Posture/Postural Control Postural limitations    Postural Limitations Forward head      AROM   Overall AROM  --   UE WNLS   Overall AROM Comments Stiffness with cervical retraction     Cervical Flexion WFLs    Cervical Extension WFLs    Cervical - Right Side Bend 38    Cervical - Left Side Bend 34    Cervical - Right Rotation 70    Cervical - Left Rotation 70      Strength   Overall Strength Comments WNLs      Palpation   Spinal mobility Normal spinal mobility     Palpation comment decreased suboccipital, upper trap and levator muscle lengths on right       Distraction Test   Comment no change                      Objective measurements completed on examination: See above findings.               PT Education - 01/05/20 2026    Education Details supine and seated  cervical retraction;  sleeping positioning using cervical roll for sidelying or supine    Person(s) Educated Patient    Methods Explanation;Demonstration;Handout    Comprehension Returned demonstration;Verbalized understanding            PT Short Term Goals - 01/05/20 2027      PT SHORT TERM GOAL #1   Title The patient will express understanding of basic self care strategies for headache and neck pain management    Time 6    Period Weeks    Status New    Target Date 02/16/20      PT SHORT TERM GOAL #2   Title The patient will report a 25% improvement in neck pain and headaches in the mornings    Time 6    Period Weeks    Status New             PT Long Term Goals - 01/05/20 2028      PT LONG TERM GOAL #1   Title The patient will be independent in self management and HEP for neck pain and headache control    Time 12    Period Weeks    Status New    Target Date 03/29/20      PT LONG TERM GOAL #2   Title The patient will have improved bil cervical sidebending to 45 degrees bil    Time 12    Period Weeks    Status New      PT LONG TERM GOAL #3   Title The patient will report a 50% improvement in right neck pain in the mornings    Time 12    Period Weeks    Status New      PT LONG TERM GOAL #4   Title Decreased frequency of headaches to 1x every 2-3 weeks    Time 12    Period Weeks    Status New                  Plan - 01/05/20 2028    Clinical Impression Statement The patient reports a several year history of headaches  but a change recently to include right neck pain with pain radiating into the right frontal part of her head.  She reports the pain is the worst first thing in the morning and better as the day goes on.  Headache frequency is variable but generally occurs 1x every 1-2 weeks.  Cervical ROM is generally normal except sidebending left 34 degrees, right 38 degrees.  Head forward posture with stiffness with cervical retraction to neutral. Normal  cervical joint mobility.  Tender points in right upper trap and levator scap muscles.  Shortened subociipitals.   Negative neural signs, no sensory or motor deficits.  The patient would benefit from PT to to include patient education on sleeping positioning, postural alignment, headache relief strategies, manual therapy and dry needling.  She has financial concerns which may limit her number of visits.    Personal Factors and Comorbidities Time since onset of injury/illness/exacerbation;Finances    Examination-Activity Limitations Sleep    Examination-Participation Restrictions Personal Finances;Other    Stability/Clinical Decision Making Stable/Uncomplicated    Clinical Decision Making Low    Rehab Potential Good    PT Frequency 1x / week    PT Duration 12 weeks    PT Treatment/Interventions ADLs/Self Care Home Management;Cryotherapy;Electrical Stimulation;Ultrasound;Traction;Moist Heat;Therapeutic activities;Therapeutic exercise;Neuromuscular re-education;Manual techniques;Patient/family education;Taping;Dry needling;Spinal Manipulations    PT Next Visit Plan DN right upper trap, levator and suboccipitals;  review cervical retractions; instruct in Mulligan self mobs with movement rotation and cervical flexion for headache relief  and upper trap and levator stretching    PT Home Exercise Plan [email or web address]    Recommended Other Services pt concerned about cost of therapy;  would like to limit interventions as much as possible and do ex at home    Consulted and Agree with Plan of Care Patient           Patient will benefit from skilled therapeutic intervention in order to improve the following deficits and impairments:  Increased fascial restricitons, Pain, Decreased range of motion  Visit Diagnosis: Cervicalgia - Plan: PT plan of care cert/re-cert  Cervicogenic headache - Plan: PT plan of care cert/re-cert     Problem List Patient Active Problem List   Diagnosis Date Noted  . Menstrual  migraine 08/11/2015  . Family history of thyroid disease 12/15/2013  . High serum thyroid stimulating hormone (TSH) 12/15/2013  . History of dilatation and curettage 12/15/2013  . History of infertility 12/15/2013  . 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 SVD (spontaneous vaginal delivery) 07/15/2012  . Postpartum care following vaginal delivery (6/3) 07/15/2012  . 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 AMA (advanced maternal age) multigravida 35+ 12/21/2010  . Abnormal first trimester screen 12/21/2010   Lavinia Sharps, PT 01/05/20 8:36 PM Phone: [phone] Fax: [phone] Vivien Presto 01/05/2020, 8:36 PM  [facility] [address], [address] [place], [place], [address] Phone: [phone]   Fax:  [phone]  Name: SANTORIA CHASON MRN: [id number] Date of Birth: 1970-03-05

## 2020-01-05 NOTE — Patient Instructions (Signed)
Access Code: [email or web address] URL: [email or web address] Date: 01/05/2020 Prepared by: Lavinia Sharps  Exercises Seated Cervical Retraction - 1 x daily - 7 x weekly - 1 sets - 10 reps Supine Chin Tuck - 1 x daily - 7 x weekly - 1 sets - 10 reps

## 2020-01-11 ENCOUNTER — Encounter: Payer: Self-pay | Admitting: Physical Therapy

## 2020-01-11 ENCOUNTER — Ambulatory Visit: Payer: 59 | Admitting: Physical Therapy

## 2020-01-11 ENCOUNTER — Other Ambulatory Visit: Payer: Self-pay

## 2020-01-11 DIAGNOSIS — G4486 Cervicogenic headache: Secondary | ICD-10-CM

## 2020-01-11 DIAGNOSIS — M542 Cervicalgia: Secondary | ICD-10-CM | POA: Diagnosis not present

## 2020-01-11 NOTE — Patient Instructions (Signed)
Access Code: [email or web address]  URL: [email or web address] Date: 01/11/2020 Prepared by: Loistine Simas Rodneshia Greenhouse  Exercises Seated Cervical Retraction - 1 x daily - 7 x weekly - 1 sets - 10 reps Supine Chin Tuck - 1 x daily - 7 x weekly - 1 sets - 10 reps Supine Head Nod Deep Neck Flexor Training - 1 x daily - 7 x weekly - 1 sets - 10 reps - 5 hold Supine Cervical Rotation AROM on Pillow - 1 x daily - 7 x weekly - 1 sets - 10 reps Supine Shoulder External Rotation with Resistance - 1 x daily - 7 x weekly - 3 sets - 10 reps Supine Shoulder Horizontal Abduction with Resistance - 1 x daily - 7 x weekly - 3 sets - 10 reps

## 2020-01-11 NOTE — Therapy (Addendum)
[facility] [address] [place], [place], [address] Phone: [phone]   Fax:  [phone]  Physical Therapy Treatment  Patient Details  Name: Tiffany Webb MRN: [id number] Date of Birth: 04-24-1970 Referring Provider (PT): Dr Sela Hilding    Encounter Date: 01/11/2020   PT End of Session - 01/11/20 0941    Visit Number 2    Date for PT Re-Evaluation 03/29/20    Authorization Type [employer]    PT Start Time 0935    PT Stop Time 1025    PT Time Calculation (min) 50 min    Activity Tolerance Patient tolerated treatment well    Behavior During Therapy [facility] for tasks assessed/performed           Past Medical History:  Diagnosis Date  . Abnormal Pap smear of cervix   . AMA (advanced maternal age) multigravida 55+   . Gonadotropin resistant ovary syndrome   . Hx of Bell's palsy    several months ago  . Hypothyroidism   . Menstrual migraine 08/11/2015  . Migraines   . No pertinent past medical history   . PONV (postoperative nausea and vomiting)     Past Surgical History:  Procedure Laterality Date  . DILATION AND EVACUATION  01/08/2011   Procedure: DILATATION AND EVACUATION (D&E) 2ND TRIMESTER;  Surgeon: [name];  Location: [facility];  Service: Gynecology;  Laterality: N/A;  . GYNECOLOGIC CRYOSURGERY    . WISDOM TOOTH EXTRACTION      There were no vitals filed for this visit.   Subjective Assessment - 01/11/20 0938    Subjective Some Rt sided neck pain on waking today.  I am trying the towel roll sleeping but it is kind of hard and shifted through the night.    Pertinent History headache freq variable but approx 1x every 1-2 weeks    Diagnostic tests osteopath said looked OK x-ray    Currently in Pain? Yes    Pain Score 3     Pain Location Neck    Pain Orientation Right    Pain Descriptors / Indicators Tightness    Pain Type Chronic pain    Pain Radiating Towards not today     Pain Onset More than a month ago    Aggravating Factors  AM    Pain Relieving Factors better as day goes on                             [facility] Adult PT Treatment/Exercise - 01/11/20 0001      Neuro Re-ed    Neuro Re-ed Details  postural cueing with head nodding, retraction, and retraction with rotation, during supine tbands      Exercises   Exercises Neck;Shoulder      Neck Exercises: Supine   Neck Retraction 10 reps    Neck Retraction Limitations with band ther ex    Capital Flexion 5 reps;5 secs    Capital Flexion Limitations add chin tip side to side for unilateral SO stretch    Cervical Rotation Both;10 reps    Cervical Rotation Limitations in retraction      Shoulder Exercises: Supine   Horizontal ABduction Strengthening;Both;10 reps;Theraband    Theraband Level (Shoulder Horizontal ABduction) Level 1 (Yellow)    Horizontal ABduction Limitations with neck retraction    External Rotation Strengthening;Both;10 reps;Theraband    Theraband Level (Shoulder External Rotation) Level 1 (Yellow)    External  Rotation Limitations with neck retraction      Modalities   Modalities Moist Heat      Moist Heat Therapy   Number Minutes Moist Heat 10 Minutes    Moist Heat Location Cervical      Manual Therapy   Manual Therapy Soft tissue mobilization;Joint mobilization    Joint Mobilization upper tspine PAs Gr I/II    Soft tissue mobilization bil SO and bil upper traps            Trigger Point Dry Needling - 01/11/20 0001    Consent Given? Yes    Education Handout Provided Previously provided    Muscles Treated Head and Neck Upper trapezius;Suboccipitals    Dry Needling Comments bil SO, Rt upper trap    Upper Trapezius Response Twitch reponse elicited;Palpable increased muscle length    Suboccipitals Response Twitch response elicited;Palpable increased muscle length                PT Education - 01/11/20 1014    Education Details Access Code:  [email or web address]    Person(s) Educated Patient    Methods Explanation;Demonstration    Comprehension Verbalized understanding            PT Short Term Goals - 01/05/20 2027      PT SHORT TERM GOAL #1   Title The patient will express understanding of basic self care strategies for headache and neck pain management    Time 6    Period Weeks    Status New    Target Date 02/16/20      PT SHORT TERM GOAL #2   Title The patient will report a 25% improvement in neck pain and headaches in the mornings    Time 6    Period Weeks    Status New             PT Long Term Goals - 01/05/20 2028      PT LONG TERM GOAL #1   Title The patient will be independent in self management and HEP for neck pain and headache control    Time 12    Period Weeks    Status New    Target Date 03/29/20      PT LONG TERM GOAL #2   Title The patient will have improved bil cervical sidebending to 45 degrees bil    Time 12    Period Weeks    Status New      PT LONG TERM GOAL #3   Title The patient will report a 50% improvement in right neck pain in the mornings    Time 12    Period Weeks    Status New      PT LONG TERM GOAL #4   Title Decreased frequency of headaches to 1x every 2-3 weeks    Time 12    Period Weeks    Status New                 Plan - 01/11/20 1235    Clinical Impression Statement Pt arrived with Rt sided neck stiffness without headache.  Towel roll at night didn't work so PT encouraged her to try folded thick towel under countoured pillow to raise its height for comfort in SL.  PT progressed HEP to include scapular stabilization in supine and to encourage deep neck stabilization activation and painfree mobility.  Pt had TPs in bil SO and Rt upper trap which PT performed DN with good twitch/elongation.  Pt was  better able to perform neck retraction following manual therapy.  Continue along POC with ongoing assessment and progression of HEP.    Rehab Potential Good    PT  Frequency 1x / week    PT Duration 12 weeks    PT Treatment/Interventions ADLs/Self Care Home Management;Cryotherapy;Electrical Stimulation;Ultrasound;Traction;Moist Heat;Therapeutic activities;Therapeutic exercise;Neuromuscular re-education;Manual techniques;Patient/family education;Taping;Dry needling;Spinal Manipulations    PT Next Visit Plan f/u on DN to bil SO and Rt upper trap, did Pt try folded towel under countoured pillow at night?, review HEP, progress seated neck stretches, try single arm doorway stretch with head turns    PT Home Exercise Plan [email or web address]    Consulted and Agree with Plan of Care Patient           Patient will benefit from skilled therapeutic intervention in order to improve the following deficits and impairments:     Visit Diagnosis: Cervicalgia  Cervicogenic headache     Problem List Patient Active Problem List   Diagnosis Date Noted  . Menstrual migraine 08/11/2015  . Family history of thyroid disease 12/15/2013  . High serum thyroid stimulating hormone (TSH) 12/15/2013  . History of dilatation and curettage 12/15/2013  . History of infertility 12/15/2013  . 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 SVD (spontaneous vaginal delivery) 07/15/2012  . Postpartum care following vaginal delivery (6/3) 07/15/2012  . 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 AMA (advanced maternal age) multigravida 35+ 12/21/2010  . Abnormal first trimester screen 12/21/2010    Baruch Merl, PT 01/11/20 12:40 PM  PHYSICAL THERAPY DISCHARGE SUMMARY Visits from Start of Care: 2  Current functional level related to goals / functional outcomes: Pt canceled remaining visits due to financial concerns.     Remaining deficits: See above   Education / Equipment: Initial HEP Plan: Patient agrees to discharge.  Patient goals were partially met. Patient is being discharged due to the patient's request.  ?????          Baruch Merl, PT 04/06/20 9:18 AM    [facility] [address], [place], [place], [address] Phone: [phone]   Fax:  [phone]  Name: SHYIA FILLINGIM MRN: [id number] Date of Birth: Sep 07, 1970

## 2020-02-08 ENCOUNTER — Other Ambulatory Visit: Payer: Self-pay | Admitting: Obstetrics & Gynecology

## 2020-02-08 DIAGNOSIS — Z1231 Encounter for screening mammogram for malignant neoplasm of breast: Secondary | ICD-10-CM

## 2020-02-10 ENCOUNTER — Encounter: Payer: 59 | Admitting: Physical Therapy

## 2020-02-15 ENCOUNTER — Ambulatory Visit: Payer: 59 | Admitting: Physical Therapy

## 2020-02-16 ENCOUNTER — Ambulatory Visit: Payer: 59 | Admitting: Physical Therapy

## 2020-03-17 ENCOUNTER — Other Ambulatory Visit: Payer: Self-pay

## 2020-03-17 ENCOUNTER — Ambulatory Visit
Admission: RE | Admit: 2020-03-17 | Discharge: 2020-03-17 | Disposition: A | Discharge status: Home / Self Care | Payer: 59 | Source: Ambulatory Visit | Attending: Obstetrics & Gynecology | Admitting: Obstetrics & Gynecology

## 2020-03-17 DIAGNOSIS — Z1231 Encounter for screening mammogram for malignant neoplasm of breast: Secondary | ICD-10-CM

## 2020-04-06 ENCOUNTER — Other Ambulatory Visit: Payer: Self-pay

## 2020-04-06 ENCOUNTER — Ambulatory Visit (INDEPENDENT_AMBULATORY_CARE_PROVIDER_SITE_OTHER): Payer: 59 | Admitting: Obstetrics & Gynecology

## 2020-04-06 ENCOUNTER — Encounter: Payer: Self-pay | Admitting: Obstetrics & Gynecology

## 2020-04-06 VITALS — BP 126/80 | Ht 64.0 in | Wt 134.4 lb

## 2020-04-06 DIAGNOSIS — Z780 Asymptomatic menopausal state: Secondary | ICD-10-CM

## 2020-04-06 DIAGNOSIS — Z01419 Encounter for gynecological examination (general) (routine) without abnormal findings: Secondary | ICD-10-CM

## 2020-04-06 NOTE — Progress Notes (Signed)
Tiffany Webb July 31, 1970 [id number]   History:    50 y.o.  Married. Son Tiffany Webb is 88 yo.  [id number] presenting for annual gyn exam   HPI:  Started on HRT 01/2019, stopped in 07/2019 because of PMB.  Pelvic US thin endometrium at 3+ mm in 07/2019.  Progressively improved hot flushes or night sweats.  No PMB since stopped HRT.  No pelvic pain. No pain with IC. Breasts normal. BMI 23.07. Physically active. Health labs with Fam MD.  [name] normal Fall 2020.  Past medical history,surgical history, family history and social history were all reviewed and documented in the EPIC chart.  Gynecologic History Patient's last menstrual period was 07/29/2019.  Obstetric History OB History  Gravida Para Term Preterm AB Living  3 1 1  0 2 1  SAB IAB Ectopic Multiple Live Births  1 1 0 0 1    # Outcome Date GA Lbr Len/2nd Weight Sex Delivery Anes PTL Lv  3 Term 07/15/12 [redacted]w[redacted]d 13:19 / 01:55 5 lb 13 oz (2.637 kg) M Vag-Vacuum EPI  LIV  2 IAB 2012             Birth Comments: Mosaic Trisomy 21  1 SAB 2011             ROS: A ROS was performed and pertinent positives and negatives are included in the history.  GENERAL: No fevers or chills. HEENT: No change in vision, no earache, sore throat or sinus congestion. NECK: No pain or stiffness. CARDIOVASCULAR: No chest pain or pressure. No palpitations. PULMONARY: No shortness of breath, cough or wheeze. GASTROINTESTINAL: No abdominal pain, nausea, vomiting or diarrhea, melena or bright red blood per rectum. GENITOURINARY: No urinary frequency, urgency, hesitancy or dysuria. MUSCULOSKELETAL: No joint or muscle pain, no back pain, no recent trauma. DERMATOLOGIC: No rash, no itching, no lesions. ENDOCRINE: No polyuria, polydipsia, no heat or cold intolerance. No recent change in weight. HEMATOLOGICAL: No anemia or easy bruising or bleeding. NEUROLOGIC: No headache, seizures, numbness, tingling or weakness. PSYCHIATRIC: No depression, no loss  of interest in normal activity or change in sleep pattern.     Exam:   BP 126/80   Ht 5\' 4"  (1.626 m)   Wt 134 lb 6.4 oz (61 kg)   LMP 07/29/2019   BMI 23.07 kg/m   Body mass index is 23.07 kg/m.  General appearance : Well developed well nourished female. No acute distress HEENT: Eyes: no retinal hemorrhage or exudates,  Neck supple, trachea midline, no carotid bruits, no thyroidmegaly Lungs: Clear to auscultation, no rhonchi or wheezes, or rib retractions  Heart: Regular rate and rhythm, no murmurs or gallops Breast:Examined in sitting and supine position were symmetrical in appearance, no palpable masses or tenderness,  no skin retraction, no nipple inversion, no nipple discharge, no skin discoloration, no axillary or supraclavicular lymphadenopathy Abdomen: no palpable masses or tenderness, no rebound or guarding Extremities: no edema or skin discoloration or tenderness  Pelvic: Vulva: Normal             Vagina: No gross lesions or discharge  Cervix: No gross lesions or discharge  Uterus AV, normal size, shape and consistency, non-tender and mobile  Adnexa  Without masses or tenderness  Anus: Normal   Assessment/Plan:  50 y.o. female for annual exam   1. Well female exam with routine gynecological exam Normal gynecologic exam in menopause.  Pap test negative February 2021 and no previous abnormal Pap, therefore no indication to repeat a Pap  this year.  Breast exam normal.  Screening mammogram February 2022 Negative.  Colonoscopy November 2020.  Health labs through family physician, will obtain results.  Good body mass index at 23.07.  Continue with fitness and healthy nutrition.  2. Postmenopause Well on no HRT.  No PMB.  Taking Vit D supplements, Ca++ 1.5 g/d and regular weight bearing physical activities.  Other orders - cholecalciferol (VITAMIN D3) 25 MCG (1000 UNIT) tablet; Take 1,000 Units by mouth daily.  [name], 9:57 AM 04/06/2020

## 2021-01-11 ENCOUNTER — Ambulatory Visit: Payer: 59 | Admitting: Family Medicine

## 2021-01-18 ENCOUNTER — Other Ambulatory Visit: Payer: Self-pay | Admitting: Obstetrics & Gynecology

## 2021-01-18 DIAGNOSIS — Z1231 Encounter for screening mammogram for malignant neoplasm of breast: Secondary | ICD-10-CM

## 2021-02-23 NOTE — Progress Notes (Signed)
[facility] Note  03/01/2021     CHIEF COMPLAINT Patient presents for Retina Evaluation   HISTORY OF PRESENT ILLNESS: Tiffany Webb is a 51 y.o. female who presents to the clinic today for:   HPI     Retina Evaluation   In left eye.  This started 12 months ago.  Duration of 12 months.  Associated Symptoms Floaters.  Negative for Flashes, Distortion, Blind Spot, Pain, Redness, Photophobia, Glare, Trauma, Scalp Tenderness, Jaw Claudication, Shoulder/Hip pain, Fever, Weight Loss and Fatigue.  Context:  reading and watching TV.  Treatments tried include no treatments.  I, the attending physician,  performed the HPI with the patient and updated documentation appropriately.        Comments   Patient states has had blurry vision OS for the past 1-2 years. Saw Albert at Dr. Zenia Resides office and he referred patient for retinal evaluation. Patient sees occasional floater OS for the past year. No flashes.       Last edited by Bernarda Caffey, MD on 03/01/2021  3:54 PM.    Pt is here on the referral of Shirleen Schirmer, PA-C for concern of ERM OS, pt states over the past couple of years her left eye has been more blurry and unable to be corrected with glasses or CL, she states she "can still function", but her vision is not as clear as she wants it to be, she feels like her left eye doubles images when she is watching TV, she states she is not "eager to do any unnecessary procedures", pt does not take any daily medications and has no health problems  Referring physician: Shirleen Schirmer, PA-C  [address] [place],  [place] [address]  HISTORICAL INFORMATION:   Selected notes from the MEDICAL RECORD NUMBER Referred by Shirleen Schirmer, PA-C for ERM LEE:  Ocular Hx- PMH-    CURRENT MEDICATIONS: No current outpatient medications on file. (Ophthalmic Drugs)   No current facility-administered medications for this visit.  (Ophthalmic Drugs)   Current Outpatient Medications (Other)  Medication Sig   aspirin-acetaminophen-caffeine (EXCEDRIN MIGRAINE) 250-250-65 MG tablet Take by mouth every 8 (eight) hours as needed for headache.   Cholecalciferol (VITAMIN D3) 125 MCG (5000 UT) CAPS Take 1 tablet by mouth daily.   cholecalciferol (VITAMIN D3) 25 MCG (1000 UNIT) tablet Take 1,000 Units by mouth daily.   Ascorbic Acid (VITAMIN C) 1000 MG tablet  (Patient not taking: Reported on 03/01/2021)   Zinc 15 MG CAPS  (Patient not taking: Reported on 03/01/2021)   No current facility-administered medications for this visit. (Other)   REVIEW OF SYSTEMS: ROS   Positive for: Eyes Negative for: Constitutional, Gastrointestinal, Neurological, Skin, Genitourinary, Musculoskeletal, HENT, Endocrine, Cardiovascular, Respiratory, Psychiatric, Allergic/Imm, Heme/Lymph Last edited by Roselee Nova D, COT on 03/01/2021  8:34 AM.     ALLERGIES No Known Allergies  PAST MEDICAL HISTORY Past Medical History:  Diagnosis Date   Abnormal Pap smear of cervix    AMA (advanced maternal age) multigravida 35+    Gonadotropin resistant ovary syndrome    Hx of Bell's palsy    several months ago   Hypothyroidism    Menstrual migraine 08/11/2015   Migraines    No pertinent past medical history    PONV (postoperative nausea and vomiting)    Past Surgical History:  Procedure Laterality Date   DILATION AND EVACUATION  01/08/2011   Procedure: DILATATION AND EVACUATION (D&E) 2ND TRIMESTER;  Surgeon: [name];  Location: [facility];  Service: Gynecology;  Laterality: N/A;   GYNECOLOGIC CRYOSURGERY     WISDOM TOOTH EXTRACTION     FAMILY HISTORY Family History  Problem Relation Age of Onset   Osteopenia Mother    Asthma Father    Graves' disease Sister    Migraines Neg Hx    Breast cancer Neg Hx    SOCIAL HISTORY Social History   Tobacco Use   Smoking status: Never   Smokeless tobacco: Never  Vaping Use   Vaping Use: Never  used  Substance Use Topics   Alcohol use: Yes    Alcohol/week: 0.0 standard drinks    Comment: 2 drinks a week    Drug use: No       OPHTHALMIC EXAM:  Base Eye Exam     Visual Acuity (Snellen - Linear)       Right Left   Dist cc 20/30 -2 20/40   Dist ph cc 20/20 NI    Correction: Glasses         Tonometry (Tonopen, 8:48 AM)       Right Left   Pressure 16 17         Pupils       Dark Light Shape React APD   Right 4 3 Round Brisk None   Left 4 3 Round Brisk None         Visual Fields (Counting fingers)       Left Right    Full Full         Extraocular Movement       Right Left    Full, Ortho Full, Ortho         Neuro/Psych     Oriented x3: Yes   Mood/Affect: Normal         Dilation     Both eyes: 1.0% Mydriacyl, 2.5% Phenylephrine @ 8:48 AM           Slit Lamp and Fundus Exam     Slit Lamp Exam       Right Left   Lids/Lashes Normal Normal   Conjunctiva/Sclera White and quiet White and quiet   Cornea Clear Clear   Anterior Chamber Deep and quiet Deep and quiet   Iris Round and dilated Round and dilated   Lens Clear Clear   Anterior Vitreous Mild syneresis Mild syneresis, Posterior vitreous detachment, vitreous condensations         Fundus Exam       Right Left   Disc Pink and Sharp Pink and Sharp   C/D Ratio 0.2 0.2   Macula Flat, Good foveal reflex, No heme or edema Blunted foveal reflex, ERM with early striae and central thickening   Vessels Normal mild attenuation, mild tortuousity   Periphery Attached, peripheral cystoid degeneration, pigmented pavingstone degeneration Attached, pigmented cystoid degeneration, pigmented pavingstone           Refraction     Wearing Rx       Sphere Cylinder Axis   Right -11.50 +1.00 047   Left -11.00` Sphere          Manifest Refraction       Sphere Cylinder Axis Dist VA   Right -12.00 +1.00 050 20/20-2   Left -11.75 +0.50 033 20/30+2           IMAGING AND  PROCEDURES  Imaging and Procedures for 03/01/2021  OCT, Retina - OU - Both Eyes       Right  Eye Quality was good. Central Foveal Thickness: 271. Progression has no prior data. Findings include normal foveal contour, no IRF, no SRF, myopic contour (Partial PVD).   Left Eye Quality was good. Central Foveal Thickness: 518. Progression has no prior data. Findings include abnormal foveal contour, no IRF, no SRF, epiretinal membrane, macular pucker, preretinal fibrosis, myopic contour.   Notes *Images captured and stored on drive  Diagnosis / Impression:  OD: NFP, no IRF/SRF OS: ERM w/ central thickening, pucker and preretinal fibrosis; no IRF/SRF  Clinical management:  See below  Abbreviations: NFP - Normal foveal profile. CME - cystoid macular edema. PED - pigment epithelial detachment. IRF - intraretinal fluid. SRF - subretinal fluid. EZ - ellipsoid zone. ERM - epiretinal membrane. ORA - outer retinal atrophy. ORT - outer retinal tubulation. SRHM - subretinal hyper-reflective material. IRHM - intraretinal hyper-reflective material            ASSESSMENT/PLAN:    ICD-10-CM   1. Epiretinal membrane (ERM) of left eye  H35.372 OCT, Retina - OU - Both Eyes    2. Posterior vitreous detachment of left eye  H43.812      1. Epiretinal membrane, left eye  - The natural history, anatomy, potential for loss of vision, and treatment options including vitrectomy techniques and the complications of endophthalmitis, retinal detachment, vitreous hemorrhage, cataract progression and permanent vision loss discussed with the patient. - ERM w/ central retinal thickening, pucker and preretinal fibrosis OS - BCVA 20/30 - pt asymptomatic, no frank metamorphopsia - no indication for surgery at this time - monitor for now - f/u 3 mos -- DFE/OCT  2. PVD / vitreous syneresis OS  - Discussed findings and prognosis  - No RT or RD on 360 peripheral exam  - Reviewed s/s of RT/RD  - Strict return  precautions for any such RT/RD signs/symptoms  Ophthalmic Meds Ordered this visit:  No orders of the defined types were placed in this encounter.    Return in about 3 months (around 05/30/2021) for f/u 3 months, ERM OS, DFE, OCT.  There are no Patient Instructions on file for this visit.   Explained the diagnoses, plan, and follow up with the patient and they expressed understanding.  Patient expressed understanding of the importance of proper follow up care.   Gardiner Sleeper, M.D., Ph.D. Diseases & Surgery of the Retina and Vitreous [facility]  I have reviewed the above documentation for accuracy and completeness, and I agree with the above. Gardiner Sleeper, M.D., Ph.D. 03/01/21 4:02 PM   Abbreviations: M myopia (nearsighted); A astigmatism; H hyperopia (farsighted); P presbyopia; Mrx spectacle prescription;  CTL contact lenses; OD right eye; OS left eye; OU both eyes  XT exotropia; ET esotropia; PEK punctate epithelial keratitis; PEE punctate epithelial erosions; DES dry eye syndrome; MGD meibomian gland dysfunction; ATs artificial tears; PFAT's preservative free artificial tears; [place] nuclear sclerotic cataract; PSC posterior subcapsular cataract; ERM epi-retinal membrane; PVD posterior vitreous detachment; RD retinal detachment; DM diabetes mellitus; DR diabetic retinopathy; NPDR non-proliferative diabetic retinopathy; PDR proliferative diabetic retinopathy; CSME clinically significant macular edema; DME diabetic macular edema; dbh dot blot hemorrhages; CWS cotton wool spot; POAG primary open angle glaucoma; C/D cup-to-disc ratio; HVF humphrey visual field; GVF goldmann visual field; OCT optical coherence tomography; IOP intraocular pressure; BRVO Branch retinal vein occlusion; CRVO central retinal vein occlusion; CRAO central retinal artery occlusion; BRAO branch retinal artery occlusion; RT retinal tear; SB scleral buckle; PPV pars plana vitrectomy; VH Vitreous  hemorrhage; PRP panretinal laser photocoagulation; IVK intravitreal kenalog; VMT vitreomacular traction; MH Macular hole;  NVD neovascularization of the disc; NVE neovascularization elsewhere; AREDS age related eye disease study; ARMD age related macular degeneration; POAG primary open angle glaucoma; EBMD epithelial/anterior basement membrane dystrophy; ACIOL anterior chamber intraocular lens; IOL intraocular lens; PCIOL posterior chamber intraocular lens; Phaco/IOL phacoemulsification with intraocular lens placement; [place] photorefractive keratectomy; LASIK laser assisted in situ keratomileusis; HTN hypertension; DM diabetes mellitus; COPD chronic obstructive pulmonary disease

## 2021-03-01 ENCOUNTER — Encounter (INDEPENDENT_AMBULATORY_CARE_PROVIDER_SITE_OTHER): Payer: Managed Care, Other (non HMO) | Admitting: Ophthalmology

## 2021-03-01 ENCOUNTER — Other Ambulatory Visit: Payer: Self-pay

## 2021-03-01 ENCOUNTER — Encounter (INDEPENDENT_AMBULATORY_CARE_PROVIDER_SITE_OTHER): Payer: Self-pay | Admitting: Ophthalmology

## 2021-03-01 ENCOUNTER — Ambulatory Visit (INDEPENDENT_AMBULATORY_CARE_PROVIDER_SITE_OTHER): Payer: Managed Care, Other (non HMO) | Admitting: Ophthalmology

## 2021-03-01 DIAGNOSIS — H3581 Retinal edema: Secondary | ICD-10-CM

## 2021-03-01 DIAGNOSIS — H43812 Vitreous degeneration, left eye: Secondary | ICD-10-CM

## 2021-03-01 DIAGNOSIS — H35372 Puckering of macula, left eye: Secondary | ICD-10-CM

## 2021-03-20 ENCOUNTER — Ambulatory Visit
Admission: RE | Admit: 2021-03-20 | Discharge: 2021-03-20 | Disposition: A | Discharge status: Home / Self Care | Payer: 59 | Source: Ambulatory Visit | Attending: Obstetrics & Gynecology | Admitting: Obstetrics & Gynecology

## 2021-03-20 DIAGNOSIS — Z1231 Encounter for screening mammogram for malignant neoplasm of breast: Secondary | ICD-10-CM

## 2021-04-07 ENCOUNTER — Ambulatory Visit: Payer: 59 | Admitting: Obstetrics & Gynecology

## 2021-04-20 ENCOUNTER — Ambulatory Visit (INDEPENDENT_AMBULATORY_CARE_PROVIDER_SITE_OTHER): Payer: Managed Care, Other (non HMO) | Admitting: Obstetrics & Gynecology

## 2021-04-20 ENCOUNTER — Other Ambulatory Visit: Payer: Self-pay

## 2021-04-20 ENCOUNTER — Other Ambulatory Visit (HOSPITAL_COMMUNITY)
Admission: RE | Admit: 2021-04-20 | Discharge: 2021-04-20 | Disposition: A | Discharge status: Home / Self Care | Payer: Managed Care, Other (non HMO) | Source: Ambulatory Visit | Attending: Obstetrics & Gynecology | Admitting: Obstetrics & Gynecology

## 2021-04-20 ENCOUNTER — Encounter: Payer: Self-pay | Admitting: Obstetrics & Gynecology

## 2021-04-20 VITALS — BP 138/82 | HR 76 | Ht 63.25 in | Wt 140.0 lb

## 2021-04-20 DIAGNOSIS — Z01419 Encounter for gynecological examination (general) (routine) without abnormal findings: Secondary | ICD-10-CM | POA: Insufficient documentation

## 2021-04-20 DIAGNOSIS — Z780 Asymptomatic menopausal state: Secondary | ICD-10-CM

## 2021-04-20 NOTE — Progress Notes (Signed)
? ? ?Tiffany Webb Nov 07, 1970 [id number] ? ? ?History:    51 y.o.  Married.  Son Tiffany Webb is 17 yo. ?  ?RP:  Established patient presenting for annual gyn exam  ?  ?HPI:  Postmenopause, well on no HRT. No PMB.  No pelvic pain.  No pain with IC.  Pap 03/2019 Neg.  Pap reflex today. Breasts normal. Mammo Neg 03/2021. BMI 24.6.  Physically active.  Fasting Health labs here today.  Colono normal Fall 2020. ? ?Past medical history,surgical history, family history and social history were all reviewed and documented in the EPIC chart. ? ?Gynecologic History ?Patient's last menstrual period was 07/29/2019. ? ?Obstetric History ?OB History  ?Gravida Para Term Preterm AB Living  ?3 1 1  0 2 1  ?SAB IAB Ectopic Multiple Live Births  ?1 1 0 0 1  ?  ?# Outcome Date GA Lbr Len/2nd Weight Sex Delivery Anes PTL Lv  ?3 Term 07/15/12 [id number] / 01:55 5 lb 13 oz (2.637 kg) M Vag-Vacuum EPI  LIV  ?2 IAB 2012          ?   Birth Comments: Mosaic Trisomy 21  ?1 SAB 2011          ? ? ? ?ROS: A ROS was performed and pertinent positives and negatives are included in the history. ? GENERAL: No fevers or chills. HEENT: No change in vision, no earache, sore throat or sinus congestion. NECK: No pain or stiffness. CARDIOVASCULAR: No chest pain or pressure. No palpitations. PULMONARY: No shortness of breath, cough or wheeze. GASTROINTESTINAL: No abdominal pain, nausea, vomiting or diarrhea, melena or bright red blood per rectum. GENITOURINARY: No urinary frequency, urgency, hesitancy or dysuria. MUSCULOSKELETAL: No joint or muscle pain, no back pain, no recent trauma. DERMATOLOGIC: No rash, no itching, no lesions. ENDOCRINE: No polyuria, polydipsia, no heat or cold intolerance. No recent change in weight. HEMATOLOGICAL: No anemia or easy bruising or bleeding. NEUROLOGIC: No headache, seizures, numbness, tingling or weakness. PSYCHIATRIC: No depression, no loss of interest in normal activity or change in sleep pattern.  ?  ? ?Exam: ? ? ?BP 138/82    Pulse 76   Ht 5' 3.25" (1.607 m)   Wt 140 lb (63.5 kg)   LMP 07/29/2019   SpO2 99%   BMI 24.60 kg/m?  ? ?Body mass index is 24.6 kg/m?. ? ?General appearance : Well developed well nourished female. No acute distress ?HEENT: Eyes: no retinal hemorrhage or exudates,  Neck supple, trachea midline, no carotid bruits, no thyroidmegaly ?Lungs: Clear to auscultation, no rhonchi or wheezes, or rib retractions  ?Heart: Regular rate and rhythm, no murmurs or gallops ?Breast:Examined in sitting and supine position were symmetrical in appearance, no palpable masses or tenderness,  no skin retraction, no nipple inversion, no nipple discharge, no skin discoloration, no axillary or supraclavicular lymphadenopathy ?Abdomen: no palpable masses or tenderness, no rebound or guarding ?Extremities: no edema or skin discoloration or tenderness ? ?Pelvic: Vulva: Normal ?            Vagina: No gross lesions or discharge ? Cervix: No gross lesions or discharge.  Pap reflex done. ? Uterus  AV, normal size, shape and consistency, non-tender and mobile ? Adnexa  Without masses or tenderness ? Anus: Normal ? ? ?Assessment/Plan:  51y.o. female for annual exam  ? ?1. Encounter for routine gynecological examination with Papanicolaou smear of cervix ?Postmenopause, well on no HRT. No PMB.  No pelvic pain.  No pain with IC.  Pap 03/2019 Neg.  Pap reflex today. Breasts normal. Mammo Neg 03/2021. BMI 24.6.  Physically active.  Fasting Health labs here today.  Colono normal Fall 2020. ?- Cytology - PAP( [place]) ?- CBC ?- Comp Met (CMET) ?- Lipid Profile ?- TSH ?- Vitamin D (25 hydroxy) ? ?2. Postmenopause ?Postmenopause, well on no HRT. No PMB.  No pelvic pain.  No pain with IC.   ? ?Other orders ?- OVER THE COUNTER MEDICATION; Vitamin D3 and MK7 ?- OVER THE COUNTER MEDICATION; Calcium-Magnesium-Zinc ?- Multiple Vitamin (MULTIVITAMIN PO); Take by mouth. ?- Omega-3 Fatty Acids (OMEGA 3 PO); Take by mouth.  ? ?[name] MD, 9:23 AM  04/20/2021 ? ?  ?

## 2021-04-21 LAB — COMPREHENSIVE METABOLIC PANEL
AG Ratio: 1.6 (calc) (ref 1.0–2.5)
ALT: 27 U/L (ref 6–29)
AST: 29 U/L (ref 10–35)
Albumin: 4.2 g/dL (ref 3.6–5.1)
Alkaline phosphatase (APISO): 80 U/L (ref 37–153)
BUN: 11 mg/dL (ref 7–25)
CO2: 26 mmol/L (ref 20–32)
Calcium: 9.3 mg/dL (ref 8.6–10.4)
Chloride: 106 mmol/L (ref 98–110)
Creat: 0.62 mg/dL (ref 0.50–1.03)
Globulin: 2.6 g/dL (calc) (ref 1.9–3.7)
Glucose, Bld: 82 mg/dL (ref 65–99)
Potassium: 4.6 mmol/L (ref 3.5–5.3)
Sodium: 141 mmol/L (ref 135–146)
Total Bilirubin: 0.4 mg/dL (ref 0.2–1.2)
Total Protein: 6.8 g/dL (ref 6.1–8.1)

## 2021-04-21 LAB — LIPID PANEL
Cholesterol: 203 mg/dL — ABNORMAL HIGH (ref <200)
HDL: 91 mg/dL (ref >=50)
LDL Cholesterol (Calc): 97 mg/dL (calc)
Non-HDL Cholesterol (Calc): 112 mg/dL (calc) (ref <130)
Total CHOL/HDL Ratio: 2.2 (calc) (ref <5.0)
Triglycerides: 60 mg/dL (ref <150)

## 2021-04-21 LAB — CBC
HCT: 36.4 % (ref 35.0–45.0)
Hemoglobin: 11.8 g/dL (ref 11.7–15.5)
MCH: 30.4 pg (ref 27.0–33.0)
MCHC: 32.4 g/dL (ref 32.0–36.0)
MCV: 93.8 fL (ref 80.0–100.0)
MPV: 10.2 fL (ref 7.5–12.5)
Platelets: 260 10*3/uL (ref 140–400)
RBC: 3.88 10*6/uL (ref 3.80–5.10)
RDW: 12.9 % (ref 11.0–15.0)
WBC: 5.2 10*3/uL (ref 3.8–10.8)

## 2021-04-21 LAB — CYTOLOGY - PAP: Diagnosis: NEGATIVE

## 2021-04-21 LAB — TSH: TSH: 2.71 mIU/L

## 2021-04-21 LAB — VITAMIN D 25 HYDROXY (VIT D DEFICIENCY, FRACTURES): Vit D, 25-Hydroxy: 56 ng/mL (ref 30–100)

## 2021-04-25 ENCOUNTER — Ambulatory Visit: Payer: Self-pay | Admitting: Family Medicine

## 2021-04-26 ENCOUNTER — Telehealth: Payer: Self-pay | Admitting: *Deleted

## 2021-04-26 NOTE — Telephone Encounter (Signed)
Patient was seen for annual exam on 04/20/21, normal exam. Patient said this am she noticed light bleeding, noticed blood more so when wiping. She asked if you think pelvic ultrasound can be ordered?  Please advise  ?

## 2021-04-26 NOTE — Telephone Encounter (Signed)
Patient calling again to inquire if she can go ahead and schedule [place] appt?  ?

## 2021-04-27 NOTE — Telephone Encounter (Signed)
Pt calling today to inquire about question on 04/26/21.  ?

## 2021-04-28 NOTE — Telephone Encounter (Signed)
Per ML ? ?"Probably bleeding secondary to the Pap test. Please reassure patient.  If bleeding persists 2 weeks after the Pap test was done, then schedule a Pelvic US." ? ?Pt notified and voiced understanding. Will let us know if still bleeding by end of next week.  ? ? ?

## 2021-05-25 NOTE — Progress Notes (Signed)
?[facility] - Clinic Note ? ?05/31/2021 ? ?  ? ?CHIEF COMPLAINT ?Patient presents for Retina Follow Up ? ? ?HISTORY OF PRESENT ILLNESS: ?Tiffany Webb is a 51 y.o. female who presents to the clinic today for:  ? ?HPI   ? ? Retina Follow Up   ?Patient presents with  Other (ERM OS).  In left eye.  This started months ago.  Duration of 3 months.  Since onset it is stable.  I, the attending physician,  performed the HPI with the patient and updated documentation appropriately. ? ?  ?  ? ? Comments   ?Patient feels that the vision has remained stable since her last visit 3 months ago. She denies noticing if the words on a page jumble together when reading.  ? ?  ?  ?Last edited by Rennis Chris, MD on 06/01/2021  2:28 PM.  ?  ?Pt feels like her vision is the same ? ?Referring physician: ?Alma Downs, PA-C  ?[facility] ?[address] ?[address] ?[place],  [place] [address] ? ?HISTORICAL INFORMATION:  ? ?Selected notes from the MEDICAL RECORD NUMBER ?Referred by Alma Downs, PA-C for ERM ?LEE:  ?Ocular Hx- ?PMH- ?  ? ?CURRENT MEDICATIONS: ?No current outpatient medications on file. (Ophthalmic Drugs)  ? ?No current facility-administered medications for this visit. (Ophthalmic Drugs)  ? ?Current Outpatient Medications (Other)  ?Medication Sig  ? aspirin-acetaminophen-caffeine (EXCEDRIN MIGRAINE) 250-250-65 MG tablet Take by mouth every 8 (eight) hours as needed for headache.  ? Multiple Vitamin (MULTIVITAMIN PO) Take by mouth.  ? Omega-3 Fatty Acids (OMEGA 3 PO) Take by mouth.  ? OVER THE COUNTER MEDICATION Vitamin D3 and MK7  ? OVER THE COUNTER MEDICATION Calcium-Magnesium-Zinc  ? ?No current facility-administered medications for this visit. (Other)  ? ?REVIEW OF SYSTEMS: ?ROS   ?Positive for: Eyes ?Negative for: Constitutional, Gastrointestinal, Neurological, Skin, Genitourinary, Musculoskeletal, HENT, Endocrine, Cardiovascular, Respiratory, Psychiatric, Allergic/Imm,  Heme/Lymph ?Last edited by Julieanne Cotton, COT on 05/31/2021  8:31 AM.  ?  ? ? ?ALLERGIES ?No Known Allergies ? ?PAST MEDICAL HISTORY ?Past Medical History:  ?Diagnosis Date  ? Abnormal Pap smear of cervix   ? AMA (advanced maternal age) multigravida 35+   ? Gonadotropin resistant ovary syndrome   ? Hx of Bell's palsy   ? several months ago  ? Hypothyroidism   ? Menstrual migraine 08/11/2015  ? Migraines   ? No pertinent past medical history   ? PONV (postoperative nausea and vomiting)   ? ?Past Surgical History:  ?Procedure Laterality Date  ? DILATION AND EVACUATION  01/08/2011  ? Procedure: DILATATION AND EVACUATION (D&E) 2ND TRIMESTER;  Surgeon: Genia Del;  Location: [facility];  Service: Gynecology;  Laterality: N/A;  ? GYNECOLOGIC CRYOSURGERY    ? WISDOM TOOTH EXTRACTION    ? ?FAMILY HISTORY ?Family History  ?Problem Relation Age of Onset  ? Osteopenia Mother   ? Asthma Father   ? Graves' disease Sister   ? Migraines Neg Hx   ? Breast cancer Neg Hx   ? ?SOCIAL HISTORY ?Social History  ? ?Tobacco Use  ? Smoking status: Never  ? Smokeless tobacco: Never  ?Vaping Use  ? Vaping Use: Never used  ?Substance Use Topics  ? Alcohol use: Yes  ?  Alcohol/week: 0.0 standard drinks  ?  Comment: 2 drinks a week   ? Drug use: No  ?  ? ?  ?OPHTHALMIC EXAM: ? ?Base Eye Exam   ? ?  Visual Acuity (Snellen - Linear)   ? ?   Right Left  ? Dist cc 20/20 20/25 +1  ? ? Correction: Contacts  ? ?  ?  ? ? Tonometry (Tonopen, 8:36 AM)   ? ?   Right Left  ? Pressure 15 17  ? ?  ?  ? ? Pupils   ? ?   Pupils Dark Light Shape React APD  ? Right PERRL 4 3 Round Brisk None  ? Left PERRL 4 3 Round Brisk None  ? ?  ?  ? ? Visual Fields   ? ?   Left Right  ?  Full Full  ? ?  ?  ? ? Extraocular Movement   ? ?   Right Left  ?  Full, Ortho Full, Ortho  ? ?  ?  ? ? Neuro/Psych   ? ? Oriented x3: Yes  ? Mood/Affect: Normal  ? ?  ?  ? ? Dilation   ? ? Both eyes: 1.0% Mydriacyl, 2.5% Phenylephrine @ 8:33 AM  ? ?  ?  ? ?  ? ?Slit Lamp and Fundus  Exam   ? ? Slit Lamp Exam   ? ?   Right Left  ? Lids/Lashes Normal Normal  ? Conjunctiva/Sclera White and quiet White and quiet  ? Cornea Clear Clear  ? Anterior Chamber Deep and quiet Deep and quiet  ? Iris Round and dilated Round and dilated  ? Lens Clear Clear  ? Anterior Vitreous Mild syneresis Mild syneresis, Posterior vitreous detachment, vitreous condensations  ? ?  ?  ? ? Fundus Exam   ? ?   Right Left  ? Disc Pink and Sharp, Compact Pink and Sharp  ? C/D Ratio 0.2 0.2  ? Macula Flat, Good foveal reflex, mild RPE mottling, No heme or edema Blunted foveal reflex, ERM with striae and central thickening  ? Vessels Normal attenuated, mild tortuosity  ? Periphery Attached, peripheral cystoid degeneration, pigmented pavingstone degeneration Attached, pigmented cystoid degeneration, pigmented pavingstone  ? ?  ?  ? ?  ? ?Refraction   ? ? Wearing Rx   ? ?   Sphere Cylinder Axis  ? Right -11.50 +1.00 047  ? Left -11.00` Sphere   ? ?  ?  ? ?  ? ?IMAGING AND PROCEDURES  ?Imaging and Procedures for 05/31/2021 ? ?OCT, Retina - OU - Both Eyes   ? ?   ?Right Eye ?Quality was good. Central Foveal Thickness: 271. Progression has been stable. Findings include normal foveal contour, no IRF, no SRF, myopic contour (Partial PVD).  ? ?Left Eye ?Quality was good. Central Foveal Thickness: 523. Progression has been stable. Findings include abnormal foveal contour, no IRF, no SRF, epiretinal membrane, macular pucker, preretinal fibrosis, myopic contour (ERM w/ central thickening, pucker and preretinal fibrosis; no IRF/SRF -- stable from prior).  ? ?Notes ?*Images captured and stored on drive ? ?Diagnosis / Impression:  ?OD: NFP, no IRF/SRF ?OS: ERM w/ central thickening, pucker and preretinal fibrosis; no IRF/SRF -- stable from prior ? ?Clinical management:  ?See below ? ?Abbreviations: NFP - Normal foveal profile. CME - cystoid macular edema. PED - pigment epithelial detachment. IRF - intraretinal fluid. SRF - subretinal fluid. EZ -  ellipsoid zone. ERM - epiretinal membrane. ORA - outer retinal atrophy. ORT - outer retinal tubulation. SRHM - subretinal hyper-reflective material. IRHM - intraretinal hyper-reflective material ? ? ?  ?  ?  ? ?  ?ASSESSMENT/PLAN: ? ?  ICD-10-CM   ?  1. Epiretinal membrane (ERM) of left eye  H35.372 OCT, Retina - OU - Both Eyes  ?  ?2. Posterior vitreous detachment of left eye  H43.812   ?  ? ? ?1. Epiretinal membrane, left eye  ?- ERM w/ central retinal thickening, pucker and preretinal fibrosis OS ?- BCVA 20/25 OS - improved from 20/30 -- checked with CL today v glasses at last visit ?- pt asymptomatic, no frank metamorphopsia ?- no indication for surgery at this time ?- monitor for now ?- f/u 6-9 mos -- DFE/OCT ? ?2. PVD / vitreous syneresis OS ? - Discussed findings and prognosis ? - No RT or RD on 360 peripheral exam ? - Reviewed s/s of RT/RD ? - Strict return precautions for any such RT/RD signs/symptoms ? ?Ophthalmic Meds Ordered this visit:  ?No orders of the defined types were placed in this encounter. ?  ? ?Return for f/u 6-9 months, ERM OS, DFE, OCT. ? ?There are no Patient Instructions on file for this visit. ? ? ?Explained the diagnoses, plan, and follow up with the patient and they expressed understanding.  Patient expressed understanding of the importance of proper follow up care.  ? ?This document serves as a record of services personally performed by Karie Chimera, MD, PhD. It was created on their behalf by De Blanch, an ophthalmic technician. The creation of this record is the provider's dictation and/or activities during the visit.   ? ?Electronically signed by: De Blanch, OA, 06/01/21  2:29 PM ? ?This document serves as a record of services personally performed by Karie Chimera, MD, PhD. It was created on their behalf by Glee Arvin. Manson Passey, OA an ophthalmic technician. The creation of this record is the provider's dictation and/or activities during the visit.   ? ?Electronically  signed by: Glee Arvin. Manson Passey, [place] 04.19.2023 2:29 PM ? ?Karie Chimera, M.D., Ph.D. ?Diseases & Surgery of the Retina and Vitreous ?[facility] ? ?I have reviewed the above documentation for accur

## 2021-05-31 ENCOUNTER — Encounter (INDEPENDENT_AMBULATORY_CARE_PROVIDER_SITE_OTHER): Payer: Self-pay | Admitting: Ophthalmology

## 2021-05-31 ENCOUNTER — Ambulatory Visit (INDEPENDENT_AMBULATORY_CARE_PROVIDER_SITE_OTHER): Payer: Managed Care, Other (non HMO) | Admitting: Ophthalmology

## 2021-05-31 DIAGNOSIS — H43812 Vitreous degeneration, left eye: Secondary | ICD-10-CM | POA: Diagnosis not present

## 2021-05-31 DIAGNOSIS — H35372 Puckering of macula, left eye: Secondary | ICD-10-CM | POA: Diagnosis not present

## 2021-06-01 ENCOUNTER — Encounter (INDEPENDENT_AMBULATORY_CARE_PROVIDER_SITE_OTHER): Payer: Self-pay | Admitting: Ophthalmology

## 2021-10-26 ENCOUNTER — Ambulatory Visit (INDEPENDENT_AMBULATORY_CARE_PROVIDER_SITE_OTHER): Payer: Commercial Managed Care - HMO | Admitting: Obstetrics & Gynecology

## 2021-10-26 ENCOUNTER — Encounter: Payer: Self-pay | Admitting: Obstetrics & Gynecology

## 2021-10-26 ENCOUNTER — Other Ambulatory Visit (HOSPITAL_COMMUNITY)
Admission: RE | Admit: 2021-10-26 | Discharge: 2021-10-26 | Disposition: A | Discharge status: Home / Self Care | Payer: Commercial Managed Care - HMO | Source: Ambulatory Visit | Attending: Obstetrics & Gynecology | Admitting: Obstetrics & Gynecology

## 2021-10-26 VITALS — BP 120/76 | HR 84

## 2021-10-26 DIAGNOSIS — N950 Postmenopausal bleeding: Secondary | ICD-10-CM | POA: Insufficient documentation

## 2021-10-26 NOTE — Progress Notes (Signed)
    Tiffany Webb 1970/06/22 [id number]        51 y.o.  G3P1021   RP: PMB x 1 yesterday  HPI: Light PMB yesterday.  No pelvic pain.  Postmenopause, well on no HRT.  Pap Neg in 04/2021.   OB History  Gravida Para Term Preterm AB Living  3 1 1  0 2 1  SAB IAB Ectopic Multiple Live Births  1 1 0 0 1    # Outcome Date GA Lbr Len/2nd Weight Sex Delivery Anes PTL Lv  3 Term 07/15/12 [redacted]w[redacted]d 13:19 / 01:55 5 lb 13 oz (2.637 kg) M Vag-Vacuum EPI  LIV  2 IAB 2012             Birth Comments: Mosaic Trisomy 21  1 SAB 2011            Past medical history,surgical history, problem list, medications, allergies, family history and social history were all reviewed and documented in the EPIC chart.   Directed ROS with pertinent positives and negatives documented in the history of present illness/assessment and plan.  Exam:  Vitals:   10/26/21 1026  BP: 120/76  Pulse: 84  SpO2: 99%   General appearance:  Normal  Written consent obtained for Endometrial Biopsy.  Procedure:  Vulva normal.  Speculum inserted in the vagina.  Betadine prep, Hurricane spray of the cervix.  Tenaculum on the anterior lip of the cervix.  Os finder.  Endometrial biopsy with the Pipette using negative pressure on all surfaces.  Mild specimen obtained and sent to pathology.  All instruments removed.  Good hemostasis.  Well tolerated, no Cx.  Post procedure precautions.   Assessment/Plan:  51 y.o. G3P1021   1. Postmenopausal bleeding - Surgical pathology( [place]/ [facility]) - 40 Transvaginal Non-OB; Future   [place] MD, 10:45 AM 10/26/2021

## 2021-10-30 ENCOUNTER — Encounter: Payer: Self-pay | Admitting: Obstetrics & Gynecology

## 2021-10-30 LAB — SURGICAL PATHOLOGY

## 2021-11-09 ENCOUNTER — Encounter: Payer: Self-pay | Admitting: Obstetrics & Gynecology

## 2021-11-09 ENCOUNTER — Ambulatory Visit (INDEPENDENT_AMBULATORY_CARE_PROVIDER_SITE_OTHER): Payer: Commercial Managed Care - HMO

## 2021-11-09 ENCOUNTER — Ambulatory Visit (INDEPENDENT_AMBULATORY_CARE_PROVIDER_SITE_OTHER): Payer: Commercial Managed Care - HMO | Admitting: Obstetrics & Gynecology

## 2021-11-09 VITALS — BP 118/78 | HR 71

## 2021-11-09 DIAGNOSIS — Z23 Encounter for immunization: Secondary | ICD-10-CM

## 2021-11-09 DIAGNOSIS — N951 Menopausal and female climacteric states: Secondary | ICD-10-CM | POA: Diagnosis not present

## 2021-11-09 DIAGNOSIS — Z789 Other specified health status: Secondary | ICD-10-CM

## 2021-11-09 DIAGNOSIS — N950 Postmenopausal bleeding: Secondary | ICD-10-CM | POA: Diagnosis not present

## 2021-11-09 NOTE — Progress Notes (Signed)
    Tiffany Webb [birth year] [id number]        51 y.o.  G2B6389   RP: PMB for Pelvic US   HPI:  Patient seen on 10/26/21.  Had light PMB on 10/25/21.  No pelvic pain. Postmenopause, well on no HRT with previous menstrual period on 07/29/2019.  Pap Neg in 04/2021. Urine/BMs normal.  An EBx was done on 10/26/21 showing benign proliferative endometrium, negative for hyperplasia or malignancy.     OB History  Gravida Para Term Preterm AB Living  3 1 1  0 2 1  SAB IAB Ectopic Multiple Live Births  1 1 0 0 1    # Outcome Date GA Lbr Len/2nd Weight Sex Delivery Anes PTL Lv  3 Term 07/15/12 [redacted]w[redacted]d 13:19 / 01:55 5 lb 13 oz (2.637 kg) M Vag-Vacuum EPI  LIV  2 IAB 2012             Birth Comments: Mosaic Trisomy 21  1 SAB 2011            Past medical history,surgical history, problem list, medications, allergies, family history and social history were all reviewed and documented in the EPIC chart.   Directed ROS with pertinent positives and negatives documented in the history of present illness/assessment and plan.  Exam:  Vitals:   11/09/21 1224  BP: 118/78  Pulse: 71  SpO2: 99%   General appearance:  Normal  Pelvic US today: T/V images.  Retroverted uterus normal in size and shape with no myometrial mass.  Avascular thin and symmetrical endometrium measured at 2.49 mm with no obvious mass or thickening.  Both ovaries are atrophic in appearance with positive perfusion.  Right ovary with a small collapsed follicle which is avascular and simple in appearance.  It is measured at 1.43 x 0.71 cm.  Left ovary normal.  No adnexal mass.  Trace free fluid in the right adnexa and posterior to the uterus.  EBx 10/26/21: Benign proliferative endometrium, negative for hyperplasia or malignancy.    Assessment/Plan:  51 y.o. G3P1021   1. Postmenopausal bleeding Patient seen on 10/26/21.  Had light PMB on 10/25/21.  No pelvic pain.  Postmenopause, well on no HRT with previous menstrual period on  07/29/2019.  Pap Neg in 04/2021. Urine/BMs normal.  An EBx was done on 10/26/21 showing benign proliferative endometrium, negative for hyperplasia or malignancy.  Pelvic ultrasound findings thoroughly reviewed with patient.  Endometrial line thin normal at 2.49 mm.  Uterus/Ovaries wnl.  Probably had an ovulation explaining her light menses and Proliferative endometrium on the EBx, as well as the Rt ovarian collapsed follicle on [place].  Patient reassured.  Recommend contraception until 2 years into menopause, will use condoms.  2. Perimenopause LMP 10/25/21.  Will observe.  Precautions for abnormal uterine bleeding discussed.  3. Use of condoms for contraception Condoms until 2 yrs into menopause.  4. Needs flu shot  Flu shot given.  [name] MD, 12:32 PM 11/09/2021

## 2021-11-10 ENCOUNTER — Encounter: Payer: Self-pay | Admitting: Obstetrics & Gynecology

## 2021-12-21 NOTE — Progress Notes (Shared)
[facility] - Clinic Note  12/27/2021     CHIEF COMPLAINT Patient presents for No chief complaint on file.   HISTORY OF PRESENT ILLNESS: ALEXEI EY is a 51 y.o. female who presents to the clinic today for:     Referring physician: Alma Downs, PA-C  Shon Hale, MD [address] [place],  [place] [address]  HISTORICAL INFORMATION:   Selected notes from the MEDICAL RECORD NUMBER Referred by Alma Downs, PA-C for ERM LEE:  Ocular Hx- PMH-    CURRENT MEDICATIONS: No current outpatient medications on file. (Ophthalmic Drugs)   No current facility-administered medications for this visit. (Ophthalmic Drugs)   Current Outpatient Medications (Other)  Medication Sig   aspirin-acetaminophen-caffeine (EXCEDRIN MIGRAINE) 250-250-65 MG tablet Take by mouth every 8 (eight) hours as needed for headache.   Multiple Vitamin (MULTIVITAMIN PO) Take by mouth.   Omega-3 Fatty Acids (OMEGA 3 PO) Take by mouth.   OVER THE COUNTER MEDICATION Calcium-Magnesium-Zinc   No current facility-administered medications for this visit. (Other)   REVIEW OF SYSTEMS:    ALLERGIES No Known Allergies  PAST MEDICAL HISTORY Past Medical History:  Diagnosis Date   Abnormal Pap smear of cervix    AMA (advanced maternal age) multigravida 35+    Gonadotropin resistant ovary syndrome    Hx of Bell's palsy    several months ago   Hypothyroidism    Menstrual migraine 08/11/2015   Migraines    No pertinent past medical history    PONV (postoperative nausea and vomiting)    Past Surgical History:  Procedure Laterality Date   DILATION AND EVACUATION  01/08/2011   Procedure: DILATATION AND EVACUATION (D&E) 2ND TRIMESTER;  Surgeon: Genia Del;  Location: [facility];  Service: Gynecology;  Laterality: N/A;   GYNECOLOGIC CRYOSURGERY     WISDOM TOOTH EXTRACTION     FAMILY HISTORY Family History  Problem Relation Age of Onset   Osteopenia Mother     Asthma Father    Graves' disease Sister    SOCIAL HISTORY Social History   Tobacco Use   Smoking status: Never   Smokeless tobacco: Never  Vaping Use   Vaping Use: Never used  Substance Use Topics   Alcohol use: Not Currently    Comment: 2-4 drinks a week   Drug use: No       OPHTHALMIC EXAM:  Not recorded    IMAGING AND PROCEDURES  Imaging and Procedures for 12/27/2021          ASSESSMENT/PLAN:  No diagnosis found.   1. Epiretinal membrane, left eye  - ERM w/ central retinal thickening, pucker and preretinal fibrosis OS - BCVA 20/25 OS - improved from 20/30 -- checked with CL today v glasses at last visit - pt asymptomatic, no frank metamorphopsia - no indication for surgery at this time - monitor for now - f/u 6-9 mos -- DFE/OCT  2. PVD / vitreous syneresis OS  - Discussed findings and prognosis  - No RT or RD on 360 peripheral exam  - Reviewed s/s of RT/RD  - Strict return precautions for any such RT/RD signs/symptoms  Ophthalmic Meds Ordered this visit:  No orders of the defined types were placed in this encounter.    No follow-ups on file.  There are no Patient Instructions on file for this visit.   Explained the diagnoses, plan, and follow up with the patient and they expressed understanding.  Patient expressed understanding of the importance of proper follow  up care.   This document serves as a record of services personally performed by Karie Chimera, MD, PhD. It was created on their behalf by De Blanch, an ophthalmic technician. The creation of this record is the provider's dictation and/or activities during the visit.    Electronically signed by: De Blanch, OA, 12/21/21  12:27 PM   Karie Chimera, M.D., Ph.D. Diseases & Surgery of the Retina and Vitreous [facility]     Abbreviations: M myopia (nearsighted); A astigmatism; H hyperopia (farsighted); P presbyopia; Mrx spectacle prescription;   CTL contact lenses; OD right eye; OS left eye; OU both eyes  XT exotropia; ET esotropia; PEK punctate epithelial keratitis; PEE punctate epithelial erosions; DES dry eye syndrome; MGD meibomian gland dysfunction; ATs artificial tears; PFAT's preservative free artificial tears; NSC nuclear sclerotic cataract; PSC posterior subcapsular cataract; ERM epi-retinal membrane; PVD posterior vitreous detachment; RD retinal detachment; DM diabetes mellitus; DR diabetic retinopathy; NPDR non-proliferative diabetic retinopathy; PDR proliferative diabetic retinopathy; CSME clinically significant macular edema; DME diabetic macular edema; dbh dot blot hemorrhages; CWS cotton wool spot; POAG primary open angle glaucoma; C/D cup-to-disc ratio; HVF humphrey visual field; GVF goldmann visual field; OCT optical coherence tomography; IOP intraocular pressure; BRVO Branch retinal vein occlusion; CRVO central retinal vein occlusion; CRAO central retinal artery occlusion; BRAO branch retinal artery occlusion; RT retinal tear; SB scleral buckle; PPV pars plana vitrectomy; VH Vitreous hemorrhage; PRP panretinal laser photocoagulation; IVK intravitreal kenalog; VMT vitreomacular traction; MH Macular hole;  NVD neovascularization of the disc; NVE neovascularization elsewhere; AREDS age related eye disease study; ARMD age related macular degeneration; POAG primary open angle glaucoma; EBMD epithelial/anterior basement membrane dystrophy; ACIOL anterior chamber intraocular lens; IOL intraocular lens; PCIOL posterior chamber intraocular lens; Phaco/IOL phacoemulsification with intraocular lens placement; PRK photorefractive keratectomy; LASIK laser assisted in situ keratomileusis; HTN hypertension; DM diabetes mellitus; COPD chronic obstructive pulmonary disease

## 2021-12-27 ENCOUNTER — Encounter (INDEPENDENT_AMBULATORY_CARE_PROVIDER_SITE_OTHER): Payer: Managed Care, Other (non HMO) | Admitting: Ophthalmology

## 2021-12-27 DIAGNOSIS — H35372 Puckering of macula, left eye: Secondary | ICD-10-CM

## 2021-12-27 DIAGNOSIS — H43812 Vitreous degeneration, left eye: Secondary | ICD-10-CM

## 2022-01-25 ENCOUNTER — Encounter (INDEPENDENT_AMBULATORY_CARE_PROVIDER_SITE_OTHER): Payer: Managed Care, Other (non HMO) | Admitting: Ophthalmology

## 2022-02-13 NOTE — Progress Notes (Signed)
[facility] Clinic Note  02/27/2022     CHIEF COMPLAINT Patient presents for Retina Follow Up   HISTORY OF PRESENT ILLNESS: Tiffany Webb is a 52 y.o. female who presents to the clinic today for:   HPI     Retina Follow Up   Patient presents with  Other (ERM OS).  In left eye.  This started years ago.  Duration of 9 months.  Since onset it is stable.  I, the attending physician,  performed the HPI with the patient and updated documentation appropriately.        Comments   Patient feels that the vision is the same. She is using AT's OU PRN.       Last edited by Bernarda Caffey, MD on 02/27/2022  8:47 AM.     Pt is not having any problems with her vision  Referring physician: Shirleen Schirmer, PA-C  [facility] [address] [place],  [place] [address]  HISTORICAL INFORMATION:   Selected notes from the MEDICAL RECORD NUMBER Referred by Shirleen Schirmer, PA-C for ERM LEE:  Ocular Hx- PMH-    CURRENT MEDICATIONS: No current outpatient medications on file. (Ophthalmic Drugs)   No current facility-administered medications for this visit. (Ophthalmic Drugs)   Current Outpatient Medications (Other)  Medication Sig   aspirin-acetaminophen-caffeine (EXCEDRIN MIGRAINE) 250-250-65 MG tablet Take by mouth every 8 (eight) hours as needed for headache.   Multiple Vitamin (MULTIVITAMIN PO) Take by mouth.   Omega-3 Fatty Acids (OMEGA 3 PO) Take by mouth.   OVER THE COUNTER MEDICATION Calcium-Magnesium-Zinc   No current facility-administered medications for this visit. (Other)   REVIEW OF SYSTEMS: ROS   Positive for: Eyes Negative for: Constitutional, Gastrointestinal, Neurological, Skin, Genitourinary, Musculoskeletal, HENT, Endocrine, Cardiovascular, Respiratory, Psychiatric, Allergic/Imm, Heme/Lymph Last edited by Annie Paras, COT on 02/27/2022  8:27 AM.     ALLERGIES No Known Allergies  PAST MEDICAL  HISTORY Past Medical History:  Diagnosis Date   Abnormal Pap smear of cervix    AMA (advanced maternal age) multigravida 35+    Gonadotropin resistant ovary syndrome    Hx of Bell's palsy    several months ago   Hypothyroidism    Menstrual migraine 08/11/2015   Migraines    No pertinent past medical history    PONV (postoperative nausea and vomiting)    Past Surgical History:  Procedure Laterality Date   DILATION AND EVACUATION  01/08/2011   Procedure: DILATATION AND EVACUATION (D&E) 2ND TRIMESTER;  Surgeon: [name];  Location: [facility];  Service: Gynecology;  Laterality: N/A;   GYNECOLOGIC CRYOSURGERY     WISDOM TOOTH EXTRACTION     FAMILY HISTORY Family History  Problem Relation Age of Onset   Osteopenia Mother    Asthma Father    Graves' disease Sister    SOCIAL HISTORY Social History   Tobacco Use   Smoking status: Never   Smokeless tobacco: Never  Vaping Use   Vaping Use: Never used  Substance Use Topics   Alcohol use: Not Currently    Comment: 2-4 drinks a week   Drug use: No       OPHTHALMIC EXAM:  Base Eye Exam     Visual Acuity (Snellen - Linear)       Right Left   Dist  20/20 20/25 +1    Correction: Glasses         Tonometry (Tonopen, 8:30 AM)  Right Left   Pressure 14 17         Pupils       Dark Light Shape React APD   Right 4 3 Round Brisk None   Left 4 3 Round Brisk None         Visual Fields       Left Right    Full Full         Extraocular Movement       Right Left    Full, Ortho Full, Ortho         Neuro/Psych     Oriented x3: Yes   Mood/Affect: Normal         Dilation     Both eyes: 1.0% Mydriacyl, 2.5% Phenylephrine @ 8:28 AM           Slit Lamp and Fundus Exam     Slit Lamp Exam       Right Left   Lids/Lashes Dermatochalasis - upper lid, mild MGD Dermatochalasis - upper lid, mild MGD   Conjunctiva/Sclera White and quiet White and quiet   Cornea trace PEE trace PEE    Anterior Chamber deep and clear deep and clear   Iris Round and dilated Round and dilated   Lens 1-2+ Nuclear sclerosis 1-2+ Nuclear sclerosis   Anterior Vitreous Mild syneresis Mild syneresis, Posterior vitreous detachment, vitreous condensations         Fundus Exam       Right Left   Disc Pink and Sharp, Compact Pink and Sharp   C/D Ratio 0.2 0.2   Macula Flat, Good foveal reflex, mild RPE mottling, No heme or edema Blunted foveal reflex, ERM with striae and central thickening -- stable   Vessels Normal mild attenuation, mild tortuosity   Periphery Attached, peripheral cystoid degeneration, pigmented pavingstone degeneration, No heme Attached, pigmented cystoid degeneration, pigmented pavingstone, No heme           Refraction     Wearing Rx       Sphere Cylinder Axis   Right -11.50 +1.00 047   Left -11.00` Sphere            IMAGING AND PROCEDURES  Imaging and Procedures for 02/27/2022  OCT, Retina - OU - Both Eyes       Right Eye Quality was good. Central Foveal Thickness: 269. Progression has been stable. Findings include normal foveal contour, no IRF, no SRF, myopic contour, vitreomacular adhesion .   Left Eye Quality was good. Central Foveal Thickness: 498. Progression has been stable. Findings include no IRF, no SRF, abnormal foveal contour, myopic contour, epiretinal membrane, macular pucker, preretinal fibrosis (Persistent ERM w/ mild decrease in retinal thickening, no IRF/SRF -- stable from prior).   Notes *Images captured and stored on drive  Diagnosis / Impression:  OD: NFP, no IRF/SRF OS: Persistent ERM w/ mild decrease in retinal thickening, no IRF/SRF -- stable from prior  Clinical management:  See below  Abbreviations: NFP - Normal foveal profile. CME - cystoid macular edema. PED - pigment epithelial detachment. IRF - intraretinal fluid. SRF - subretinal fluid. EZ - ellipsoid zone. ERM - epiretinal membrane. ORA - outer retinal atrophy. ORT -  outer retinal tubulation. SRHM - subretinal hyper-reflective material. IRHM - intraretinal hyper-reflective material            ASSESSMENT/PLAN:    ICD-10-CM   1. Epiretinal membrane (ERM) of left eye  H35.372 OCT, Retina - OU - Both Eyes    2. Posterior vitreous  detachment of left eye  H43.812     3. Nuclear sclerosis of both eyes  H25.13        1. Epiretinal membrane, left eye  - ERM w/ central retinal thickening, pucker and preretinal fibrosis OS stable - BCVA 20/25 OS - stable-- checked w/ old glasses today - pt asymptomatic, no frank metamorphopsia - no indication for surgery at this time - monitor - f/u 9 mos -- DFE/OCT  2. PVD / vitreous syneresis OS  - Discussed findings and prognosis  - No RT or RD on 360 peripheral exam  - Reviewed s/s of RT/RD  - Strict return precautions for any such RT/RD signs/symptoms  3. Nuclear sclerosis OU - The symptoms of cataract, surgical options, and treatments and risks were discussed with patient. - discussed diagnosis and progression - not yet visually significant - monitor for now   Ophthalmic Meds Ordered this visit:  No orders of the defined types were placed in this encounter.    Return in about 9 months (around 11/28/2022) for f/u ERM OS, DFE, OCT.  There are no Patient Instructions on file for this visit.   Explained the diagnoses, plan, and follow up with the patient and they expressed understanding.  Patient expressed understanding of the importance of proper follow up care.   This document serves as a record of services personally performed by Karie Chimera, MD, PhD. It was created on their behalf by Gerilyn Nestle, COT an ophthalmic technician. The creation of this record is the provider's dictation and/or activities during the visit.    Electronically signed by:  Gerilyn Nestle, COT  01.02.24 1:51 PM  This document serves as a record of services personally performed by Karie Chimera, MD, PhD. It  was created on their behalf by Glee Arvin. Manson Passey, OA an ophthalmic technician. The creation of this record is the provider's dictation and/or activities during the visit.    Electronically signed by: Glee Arvin. Manson Passey, [place] 01.16.2024 1:51 PM  Karie Chimera, M.D., Ph.D. Diseases & Surgery of the Retina and Vitreous [facility]  I have reviewed the above documentation for accuracy and completeness, and I agree with the above. Karie Chimera, M.D., Ph.D. 03/01/22 1:52 PM  Abbreviations: M myopia (nearsighted); A astigmatism; H hyperopia (farsighted); P presbyopia; Mrx spectacle prescription;  CTL contact lenses; OD right eye; OS left eye; OU both eyes  XT exotropia; ET esotropia; PEK punctate epithelial keratitis; PEE punctate epithelial erosions; DES dry eye syndrome; MGD meibomian gland dysfunction; ATs artificial tears; PFAT's preservative free artificial tears; NSC nuclear sclerotic cataract; PSC posterior subcapsular cataract; ERM epi-retinal membrane; PVD posterior vitreous detachment; RD retinal detachment; DM diabetes mellitus; DR diabetic retinopathy; NPDR non-proliferative diabetic retinopathy; PDR proliferative diabetic retinopathy; CSME clinically significant macular edema; DME diabetic macular edema; dbh dot blot hemorrhages; CWS cotton wool spot; POAG primary open angle glaucoma; C/D cup-to-disc ratio; HVF humphrey visual field; GVF goldmann visual field; OCT optical coherence tomography; IOP intraocular pressure; BRVO Branch retinal vein occlusion; CRVO central retinal vein occlusion; CRAO central retinal artery occlusion; BRAO branch retinal artery occlusion; RT retinal tear; SB scleral buckle; PPV pars plana vitrectomy; VH Vitreous hemorrhage; PRP panretinal laser photocoagulation; IVK intravitreal kenalog; VMT vitreomacular traction; MH Macular hole;  NVD neovascularization of the disc; NVE neovascularization elsewhere; AREDS age related eye disease study; ARMD age  related macular degeneration; POAG primary open angle glaucoma; EBMD epithelial/anterior basement membrane dystrophy; ACIOL anterior chamber intraocular lens; IOL intraocular lens;  PCIOL posterior chamber intraocular lens; Phaco/IOL phacoemulsification with intraocular lens placement; [place] photorefractive keratectomy; LASIK laser assisted in situ keratomileusis; HTN hypertension; DM diabetes mellitus; COPD chronic obstructive pulmonary disease

## 2022-02-19 DIAGNOSIS — D2339 Other benign neoplasm of skin of other parts of face: Secondary | ICD-10-CM | POA: Diagnosis not present

## 2022-02-19 DIAGNOSIS — L821 Other seborrheic keratosis: Secondary | ICD-10-CM | POA: Diagnosis not present

## 2022-02-19 DIAGNOSIS — L245 Irritant contact dermatitis due to other chemical products: Secondary | ICD-10-CM | POA: Diagnosis not present

## 2022-02-27 ENCOUNTER — Encounter (INDEPENDENT_AMBULATORY_CARE_PROVIDER_SITE_OTHER): Payer: Self-pay | Admitting: Ophthalmology

## 2022-02-27 ENCOUNTER — Ambulatory Visit (INDEPENDENT_AMBULATORY_CARE_PROVIDER_SITE_OTHER): Payer: 59 | Admitting: Ophthalmology

## 2022-02-27 DIAGNOSIS — H2513 Age-related nuclear cataract, bilateral: Secondary | ICD-10-CM

## 2022-02-27 DIAGNOSIS — H43812 Vitreous degeneration, left eye: Secondary | ICD-10-CM | POA: Diagnosis not present

## 2022-02-27 DIAGNOSIS — H35372 Puckering of macula, left eye: Secondary | ICD-10-CM | POA: Diagnosis not present

## 2022-04-09 ENCOUNTER — Other Ambulatory Visit: Payer: Self-pay | Admitting: Obstetrics & Gynecology

## 2022-04-09 DIAGNOSIS — Z1231 Encounter for screening mammogram for malignant neoplasm of breast: Secondary | ICD-10-CM

## 2022-04-25 ENCOUNTER — Ambulatory Visit: Payer: Managed Care, Other (non HMO) | Admitting: Obstetrics & Gynecology

## 2022-05-28 ENCOUNTER — Ambulatory Visit
Admission: RE | Admit: 2022-05-28 | Discharge: 2022-05-28 | Disposition: A | Discharge status: Home / Self Care | Payer: Self-pay | Source: Ambulatory Visit | Attending: Obstetrics & Gynecology | Admitting: Obstetrics & Gynecology

## 2022-05-28 DIAGNOSIS — Z1231 Encounter for screening mammogram for malignant neoplasm of breast: Secondary | ICD-10-CM

## 2022-05-31 ENCOUNTER — Ambulatory Visit (INDEPENDENT_AMBULATORY_CARE_PROVIDER_SITE_OTHER): Payer: 59 | Admitting: Obstetrics & Gynecology

## 2022-05-31 ENCOUNTER — Encounter: Payer: Self-pay | Admitting: Obstetrics & Gynecology

## 2022-05-31 VITALS — BP 120/80 | HR 66 | Ht 63.25 in | Wt 134.0 lb

## 2022-05-31 DIAGNOSIS — E559 Vitamin D deficiency, unspecified: Secondary | ICD-10-CM | POA: Diagnosis not present

## 2022-05-31 DIAGNOSIS — Z780 Asymptomatic menopausal state: Secondary | ICD-10-CM | POA: Diagnosis not present

## 2022-05-31 DIAGNOSIS — Z01419 Encounter for gynecological examination (general) (routine) without abnormal findings: Secondary | ICD-10-CM | POA: Diagnosis not present

## 2022-05-31 DIAGNOSIS — E785 Hyperlipidemia, unspecified: Secondary | ICD-10-CM | POA: Diagnosis not present

## 2022-05-31 DIAGNOSIS — R7989 Other specified abnormal findings of blood chemistry: Secondary | ICD-10-CM | POA: Diagnosis not present

## 2022-05-31 NOTE — Progress Notes (Signed)
Tiffany Webb Feb 22, 1970 [id number]   History:    52 y.o.  Married.  Son [name] is 21 yo.   RP:  Established patient presenting for annual gyn exam    HPI:  Postmenopause, well on no HRT. No PMB.  EBx was done on 10/26/21 showing benign proliferative endometrium, negative for hyperplasia or malignancy.  Pelvic ultrasound 11/09/2021 Endometrial line thin normal at 2.49 mm. No pelvic pain.  No pain with IC.  Pap 04/2021 Neg.  Repeat Pap at 3 years. Breasts normal. Mammo Neg 05/2022. BMI 23.55.  Physically active.  Fasting Health labs here today.  [place]ono normal Fall 2020.   Past medical history,surgical history, family history and social history were all reviewed and documented in the EPIC chart.  Gynecologic History Patient's last menstrual period was 07/29/2019.  Obstetric History OB History  Gravida Para Term Preterm AB Living  0 2 1  SAB IAB Ectopic Multiple Live Births  1 1 0 0 1    # Outcome Date GA Lbr Len/2nd Weight Sex Delivery Anes PTL Lv  3 Term 07/15/12 [redacted]w[redacted]d 13:19 / 01:55 5 lb 13 oz (2.637 kg) M Vag-Vacuum EPI  LIV  2 IAB 2012             Birth Comments: Mosaic Trisomy 21  1 SAB 2011             ROS: A ROS was performed and pertinent positives and negatives are included in the history. GENERAL: No fevers or chills. HEENT: No change in vision, no earache, sore throat or sinus congestion. NECK: No pain or stiffness. CARDIOVASCULAR: No chest pain or pressure. No palpitations. PULMONARY: No shortness of breath, cough or wheeze. GASTROINTESTINAL: No abdominal pain, nausea, vomiting or diarrhea, melena or bright red blood per rectum. GENITOURINARY: No urinary frequency, urgency, hesitancy or dysuria. MUSCULOSKELETAL: No joint or muscle pain, no back pain, no recent trauma. DERMATOLOGIC: No rash, no itching, no lesions. ENDOCRINE: No polyuria, polydipsia, no heat or cold intolerance. No recent change in weight. HEMATOLOGICAL: No anemia or easy bruising or bleeding. NEUROLOGIC:  No headache, seizures, numbness, tingling or weakness. PSYCHIATRIC: No depression, no loss of interest in normal activity or change in sleep pattern.     Exam:   BP 120/80   Pulse 66   Ht 5' 3.25" (1.607 m)   Wt 134 lb (60.8 kg)   LMP 07/29/2019 Comment: sexually active  SpO2 99%   BMI 23.55 kg/m   Body mass index is 23.55 kg/m.  General appearance : Well developed well nourished female. No acute distress HEENT: Eyes: no retinal hemorrhage or exudates,  Neck supple, trachea midline, no carotid bruits, no thyroidmegaly Lungs: Clear to auscultation, no rhonchi or wheezes, or rib retractions  Heart: Regular rate and rhythm, no murmurs or gallops Breast:Examined in sitting and supine position were symmetrical in appearance, no palpable masses or tenderness,  no skin retraction, no nipple inversion, no nipple discharge, no skin discoloration, no axillary or supraclavicular lymphadenopathy Abdomen: no palpable masses or tenderness, no rebound or guarding Extremities: no edema or skin discoloration or tenderness  Pelvic: Vulva: Normal             Vagina: No gross lesions or discharge  Cervix: No gross lesions or discharge  Uterus  AV, normal size, shape and consistency, non-tender and mobile  Adnexa  Without masses or tenderness  Anus: Normal   Assessment/Plan:  52 y.o. female for annual exam   1. Well female  exam with routine gynecological exam Postmenopause, well on no HRT. No PMB.  EBx was done on 10/26/21 showing benign proliferative endometrium, negative for hyperplasia or malignancy.  Pelvic ultrasound 11/09/2021 Endometrial line thin normal at 2.49 mm. No pelvic pain.  No pain with IC.  Pap 04/2021 Neg.  Repeat Pap at 3 years. Breasts normal. Mammo Neg 05/2022. BMI 23.55.  Physically active.  Fasting Health labs here today.  Colono normal Fall 2020. - CBC - Comp Met (CMET) - TSH - Lipid Profile - Vitamin D (25 hydroxy)  2. Postmenopause  Postmenopause, well on no HRT. No PMB.   EBx was done on 10/26/21 showing benign proliferative endometrium, negative for hyperplasia or malignancy.  Pelvic ultrasound 11/09/2021 Endometrial line thin normal at 2.49 mm. No pelvic pain.  No pain with IC.  [name], 9:11 AM

## 2022-06-01 LAB — CBC
HCT: 36.7 % (ref 35.0–45.0)
Hemoglobin: 11.9 g/dL (ref 11.7–15.5)
MCH: 29.4 pg (ref 27.0–33.0)
MCHC: 32.4 g/dL (ref 32.0–36.0)
MCV: 90.6 fL (ref 80.0–100.0)
MPV: 9.7 fL (ref 7.5–12.5)
Platelets: 198 10*3/uL (ref 140–400)
RBC: 4.05 10*6/uL (ref 3.80–5.10)
RDW: 13 % (ref 11.0–15.0)
WBC: 4.8 10*3/uL (ref 3.8–10.8)

## 2022-06-01 LAB — LIPID PANEL
Cholesterol: 210 mg/dL — ABNORMAL HIGH (ref <200)
HDL: 92 mg/dL (ref >=50)
LDL Cholesterol (Calc): 105 mg/dL (calc) — ABNORMAL HIGH
Non-HDL Cholesterol (Calc): 118 mg/dL (calc) (ref <130)
Total CHOL/HDL Ratio: 2.3 (calc) (ref <5.0)
Triglycerides: 44 mg/dL (ref <150)

## 2022-06-01 LAB — COMPREHENSIVE METABOLIC PANEL
AG Ratio: 1.8 (calc) (ref 1.0–2.5)
ALT: 15 U/L (ref 6–29)
AST: 19 U/L (ref 10–35)
Albumin: 4.5 g/dL (ref 3.6–5.1)
Alkaline phosphatase (APISO): 74 U/L (ref 37–153)
BUN: 15 mg/dL (ref 7–25)
CO2: 29 mmol/L (ref 20–32)
Calcium: 8.9 mg/dL (ref 8.6–10.4)
Chloride: 106 mmol/L (ref 98–110)
Creat: 0.61 mg/dL (ref 0.50–1.03)
Globulin: 2.5 g/dL (calc) (ref 1.9–3.7)
Glucose, Bld: 88 mg/dL (ref 65–99)
Potassium: 4.1 mmol/L (ref 3.5–5.3)
Sodium: 141 mmol/L (ref 135–146)
Total Bilirubin: 0.4 mg/dL (ref 0.2–1.2)
Total Protein: 7 g/dL (ref 6.1–8.1)

## 2022-06-01 LAB — VITAMIN D 25 HYDROXY (VIT D DEFICIENCY, FRACTURES): Vit D, 25-Hydroxy: 31 ng/mL (ref 30–100)

## 2022-06-01 LAB — TSH: TSH: 2.72 mIU/L

## 2022-06-06 ENCOUNTER — Other Ambulatory Visit: Payer: Self-pay | Admitting: *Deleted

## 2022-06-06 DIAGNOSIS — E559 Vitamin D deficiency, unspecified: Secondary | ICD-10-CM

## 2022-11-22 NOTE — Progress Notes (Signed)
[facility] - Clinic Note  11/29/2022     CHIEF COMPLAINT Patient presents for Retina Follow Up   HISTORY OF PRESENT ILLNESS: Tiffany Webb is a 52 y.o. female who presents to the clinic today for:   HPI     Retina Follow Up   Patient presents with  Other.  In left eye.  Severity is moderate.  Duration of 9 months.  Since onset it is stable.  I, the attending physician,  performed the HPI with the patient and updated documentation appropriately.        Comments   Pt here for 9 mo ret f/u ERM OS. Pt states VA is stable, no changes. Pt does not use any drops currently.       Last edited by Rennis Chris, MD on 12/02/2022 12:16 AM.    Patient states the vision is the same.  Alma Downs, PA-C  [employer] [employer] [address] [place],  [place] [address]  HISTORICAL INFORMATION:   Selected notes from the MEDICAL RECORD NUMBER Referred by Alma Downs, PA-C for ERM LEE:  Ocular Hx- PMH-    CURRENT MEDICATIONS: No current outpatient medications on file. (Ophthalmic Drugs)   No current facility-administered medications for this visit. (Ophthalmic Drugs)   Current Outpatient Medications (Other)  Medication Sig   aspirin-acetaminophen-caffeine (EXCEDRIN MIGRAINE) 250-250-65 MG tablet Take by mouth every 8 (eight) hours as needed for headache.   OVER THE COUNTER MEDICATION Calcium-Magnesium-Zinc   Multiple Vitamin (MULTIVITAMIN PO) Take by mouth. (Patient not taking: Reported on 05/31/2022)   Omega-3 Fatty Acids (OMEGA 3 PO) Take by mouth. (Patient not taking: Reported on 05/31/2022)   No current facility-administered medications for this visit. (Other)   REVIEW OF SYSTEMS: ROS   Positive for: Eyes Negative for: Constitutional, Gastrointestinal, Neurological, Skin, Genitourinary, Musculoskeletal, HENT, Endocrine, Cardiovascular, Respiratory, Psychiatric, Allergic/Imm, Heme/Lymph Last edited by Thompson Grayer, COT on  11/29/2022  8:11 AM.      ALLERGIES No Known Allergies  PAST MEDICAL HISTORY Past Medical History:  Diagnosis Date   Abnormal Pap smear of cervix    AMA (advanced maternal age) multigravida 35+    Gonadotropin resistant ovary syndrome    Hx of Bell's palsy    several months ago   Hypothyroidism    Menstrual migraine 08/11/2015   Migraines    No pertinent past medical history    PONV (postoperative nausea and vomiting)    Past Surgical History:  Procedure Laterality Date   DILATION AND EVACUATION  01/08/2011   Procedure: DILATATION AND EVACUATION (D&E) 2ND TRIMESTER;  Surgeon: Genia Del;  Location: [facility];  Service: Gynecology;  Laterality: N/A;   GYNECOLOGIC CRYOSURGERY     WISDOM TOOTH EXTRACTION     FAMILY HISTORY Family History  Problem Relation Age of Onset   Osteopenia Mother    Asthma Father    Graves' disease Sister    SOCIAL HISTORY Social History   Tobacco Use   Smoking status: Never   Smokeless tobacco: Never  Vaping Use   Vaping status: Never Used  Substance Use Topics   Alcohol use: Yes    Comment: 2-4 drinks a week   Drug use: No       OPHTHALMIC EXAM:  Base Eye Exam     Visual Acuity (Snellen - Linear)       Right Left   Dist cc 20/25 +2 20/25    Correction: Contacts  Tonometry (Tonopen, 8:16 AM)       Right Left   Pressure 16 18         Pupils       Pupils Dark Light Shape React APD   Right PERRL 4 3 Round Brisk None   Left PERRL 4 3 Round Brisk None         Visual Fields (Counting fingers)       Left Right    Full Full         Extraocular Movement       Right Left    Full, Ortho Full, Ortho         Neuro/Psych     Oriented x3: Yes   Mood/Affect: Normal         Dilation     Both eyes: 1.0% Mydriacyl, 2.5% Phenylephrine @ 8:17 AM           Slit Lamp and Fundus Exam     Slit Lamp Exam       Right Left   Lids/Lashes Normal Normal   Conjunctiva/Sclera White and quiet White  and quiet   Cornea trace PEE Debris in tear film   Anterior Chamber deep and clear deep and clear   Iris Round and dilated Round and dilated   Lens 1-2+ Nuclear sclerosis 1-2+ Nuclear sclerosis   Anterior Vitreous Mild syneresis Mild syneresis, Posterior vitreous detachment, vitreous condensations         Fundus Exam       Right Left   Disc Pink and Sharp, Compact Pink and Sharp   C/D Ratio 0.2 0.2   Macula Flat, Good foveal reflex, mild RPE mottling, No heme or edema Blunted foveal reflex, ERM with striae and central thickening -- stable   Vessels Normal mild attenuation, mild tortuosity   Periphery Attached, peripheral cystoid degeneration, pigmented pavingstone degeneration, No heme Attached, pigmented cystoid degeneration, pigmented pavingstone, No heme           IMAGING AND PROCEDURES  Imaging and Procedures for 11/29/2022  OCT, Retina - OU - Both Eyes       Right Eye Quality was good. Central Foveal Thickness: 274. Progression has been stable. Findings include normal foveal contour, no IRF, no SRF, myopic contour, vitreomacular adhesion (Partial PVD).   Left Eye Quality was good. Central Foveal Thickness: 495. Progression has been stable. Findings include no IRF, no SRF, abnormal foveal contour, myopic contour, epiretinal membrane, macular pucker, preretinal fibrosis (Persistent ERM w/ PRF, no IRF/SRF -- stable from prior).   Notes *Images captured and stored on drive  Diagnosis / Impression:  OD: NFP, no IRF/SRF, partial PVD OS: Persistent ERM w/ PRF, no IRF/SRF -- stable from prior  Clinical management:  See below  Abbreviations: NFP - Normal foveal profile. CME - cystoid macular edema. PED - pigment epithelial detachment. IRF - intraretinal fluid. SRF - subretinal fluid. EZ - ellipsoid zone. ERM - epiretinal membrane. ORA - outer retinal atrophy. ORT - outer retinal tubulation. SRHM - subretinal hyper-reflective material. IRHM - intraretinal hyper-reflective  material            ASSESSMENT/PLAN:    ICD-10-CM   1. Epiretinal membrane (ERM) of left eye  H35.372 OCT, Retina - OU - Both Eyes    2. Posterior vitreous detachment of left eye  H43.812     3. Nuclear sclerosis of both eyes  H25.13      1. Epiretinal membrane, left eye  - ERM w/ central retinal thickening, pucker  and preretinal fibrosis OS -- stable - BCVA 20/25 OS - stable - pt asymptomatic, no frank metamorphopsia - no indication for surgery at this time - monitor - f/u 1 yr -- DFE/OCT  2. PVD / vitreous syneresis OS  - Discussed findings and prognosis  - No RT or RD on 360 peripheral exam  - Reviewed s/s of RT/RD  - Strict return precautions for any such RT/RD signs/symptoms  3. Nuclear sclerosis OU - The symptoms of cataract, surgical options, and treatments and risks were discussed with patient. - discussed diagnosis and progression - not yet visually significant - monitor for now   Ophthalmic Meds Ordered this visit:  No orders of the defined types were placed in this encounter.    Return in about 1 year (around 11/29/2023) for f/u ERM OS, DFE, OCT.  There are no Patient Instructions on file for this visit.   Explained the diagnoses, plan, and follow up with the patient and they expressed understanding.  Patient expressed understanding of the importance of proper follow up care.   This document serves as a record of services personally performed by Karie Chimera, MD, PhD. It was created on their behalf by Glee Arvin. Manson Passey, OA an ophthalmic technician. The creation of this record is the provider's dictation and/or activities during the visit.    Electronically signed by: Glee Arvin. Manson Passey, OA 12/02/22 12:18 AM  This document serves as a record of services personally performed by Karie Chimera, MD, PhD. It was created on their behalf by Charlette Caffey, COT an ophthalmic technician. The creation of this record is the provider's dictation and/or  activities during the visit.    Electronically signed by:  Charlette Caffey, COT  12/02/22 12:18 AM  Karie Chimera, M.D., Ph.D. Diseases & Surgery of the Retina and Vitreous [facility]  I have reviewed the above documentation for accuracy and completeness, and I agree with the above. Karie Chimera, M.D., Ph.D. 12/02/22 12:19 AM  Abbreviations: M myopia (nearsighted); A astigmatism; H hyperopia (farsighted); P presbyopia; Mrx spectacle prescription;  CTL contact lenses; OD right eye; OS left eye; OU both eyes  XT exotropia; ET esotropia; PEK punctate epithelial keratitis; PEE punctate epithelial erosions; DES dry eye syndrome; MGD meibomian gland dysfunction; ATs artificial tears; PFAT's preservative free artificial tears; NSC nuclear sclerotic cataract; PSC posterior subcapsular cataract; ERM epi-retinal membrane; PVD posterior vitreous detachment; RD retinal detachment; DM diabetes mellitus; DR diabetic retinopathy; NPDR non-proliferative diabetic retinopathy; PDR proliferative diabetic retinopathy; CSME clinically significant macular edema; DME diabetic macular edema; dbh dot blot hemorrhages; CWS cotton wool spot; POAG primary open angle glaucoma; C/D cup-to-disc ratio; HVF humphrey visual field; GVF goldmann visual field; OCT optical coherence tomography; IOP intraocular pressure; BRVO Branch retinal vein occlusion; CRVO central retinal vein occlusion; CRAO central retinal artery occlusion; BRAO branch retinal artery occlusion; RT retinal tear; SB scleral buckle; PPV pars plana vitrectomy; VH Vitreous hemorrhage; PRP panretinal laser photocoagulation; IVK intravitreal kenalog; VMT vitreomacular traction; MH Macular hole;  NVD neovascularization of the disc; NVE neovascularization elsewhere; AREDS age related eye disease study; ARMD age related macular degeneration; POAG primary open angle glaucoma; EBMD epithelial/anterior basement membrane dystrophy; ACIOL anterior  chamber intraocular lens; IOL intraocular lens; PCIOL posterior chamber intraocular lens; Phaco/IOL phacoemulsification with intraocular lens placement; PRK photorefractive keratectomy; LASIK laser assisted in situ keratomileusis; HTN hypertension; DM diabetes mellitus; COPD chronic obstructive pulmonary disease

## 2022-11-29 ENCOUNTER — Ambulatory Visit (INDEPENDENT_AMBULATORY_CARE_PROVIDER_SITE_OTHER): Payer: 59 | Admitting: Ophthalmology

## 2022-11-29 ENCOUNTER — Encounter (INDEPENDENT_AMBULATORY_CARE_PROVIDER_SITE_OTHER): Payer: Self-pay | Admitting: Ophthalmology

## 2022-11-29 DIAGNOSIS — H43812 Vitreous degeneration, left eye: Secondary | ICD-10-CM | POA: Diagnosis not present

## 2022-11-29 DIAGNOSIS — H35372 Puckering of macula, left eye: Secondary | ICD-10-CM | POA: Diagnosis not present

## 2022-11-29 DIAGNOSIS — H2513 Age-related nuclear cataract, bilateral: Secondary | ICD-10-CM

## 2022-12-02 ENCOUNTER — Encounter (INDEPENDENT_AMBULATORY_CARE_PROVIDER_SITE_OTHER): Payer: Self-pay | Admitting: Ophthalmology

## 2023-01-31 ENCOUNTER — Ambulatory Visit: Payer: 59 | Admitting: Family Medicine

## 2023-02-01 ENCOUNTER — Encounter: Payer: Self-pay | Admitting: Family Medicine

## 2023-02-01 ENCOUNTER — Ambulatory Visit (INDEPENDENT_AMBULATORY_CARE_PROVIDER_SITE_OTHER): Payer: 59 | Admitting: Family Medicine

## 2023-02-01 VITALS — BP 134/82 | Ht 63.5 in | Wt 130.0 lb

## 2023-02-01 DIAGNOSIS — M722 Plantar fascial fibromatosis: Secondary | ICD-10-CM | POA: Diagnosis not present

## 2023-02-01 NOTE — Patient Instructions (Signed)
You have plantar fasciitis Take tylenol and/or aleve as needed for pain  Plantar fascia stretch for 20-30 seconds (do 3 of these) in morning Lowering/raise on a step exercises 3 x 10 once or twice a day - this is very important for long term recovery. Can add heel walks, toe walks forward and backward as well Ice heel for 15 minutes as needed. Avoid flat shoes/barefoot walking as much as possible. Arch straps have been shown to help with pain. Continue your custom orthotics - suspect you'll need new ones in 9-12 months. Strassburg sock may be helpful at night. Consider shockwave therapy if not improving. Follow up with me in 6 weeks or as needed if you're doing well.

## 2023-02-01 NOTE — Progress Notes (Signed)
PCP: Shon Hale, MD  Subjective:   HPI: Patient is a 52 y.o. female here for right foot pain.  Patient reports plantar right foot pain for several years. No injury or trauma. Improved with orthotics but not completely. Pain more toward the heel. Given an injection once by podiatrist without much benefit. Not doing exercises for this. Pain is nagging but not severe.  Past Medical History:  Diagnosis Date   Abnormal Pap smear of cervix    AMA (advanced maternal age) multigravida 35+    Gonadotropin resistant ovary syndrome    Hx of Bell's palsy    several months ago   Hypothyroidism    Menstrual migraine 08/11/2015   Migraines    No pertinent past medical history    PONV (postoperative nausea and vomiting)     Current Outpatient Medications on File Prior to Visit  Medication Sig Dispense Refill   aspirin-acetaminophen-caffeine (EXCEDRIN MIGRAINE) 250-250-65 MG tablet Take by mouth every 8 (eight) hours as needed for headache.     Multiple Vitamin (MULTIVITAMIN PO) Take by mouth. (Patient not taking: Reported on 05/31/2022)     Omega-3 Fatty Acids (OMEGA 3 PO) Take by mouth. (Patient not taking: Reported on 05/31/2022)     OVER THE COUNTER MEDICATION Calcium-Magnesium-Zinc     No current facility-administered medications on file prior to visit.    Past Surgical History:  Procedure Laterality Date   DILATION AND EVACUATION  01/08/2011   Procedure: DILATATION AND EVACUATION (D&E) 2ND TRIMESTER;  Surgeon: Genia Del;  Location: [facility];  Service: Gynecology;  Laterality: N/A;   GYNECOLOGIC CRYOSURGERY     WISDOM TOOTH EXTRACTION      No Known Allergies  BP 134/82   Ht 5' 3.5" (1.613 m)   Wt 130 lb (59 kg)   LMP 07/29/2019 Comment: sexually active  BMI 22.67 kg/m       No data to display              No data to display              Objective:  Physical Exam:  Gen: NAD, comfortable in exam room  Right foot/ankle: Pes cavus.   Transverse arch collapse with callus over 2-4 metatarsal heads.  Hammering of digits 2-4.  No other gross deformity, swelling, ecchymoses Full range of motion ankle, digits. No tenderness to palpation currently Negative calcaneal squeeze. NV intact distally.   Assessment & Plan:  1. Right foot pain - 2/2 plantar fasciitis.  Home exercises and stretches reviewed.  Arch binders.  Continue custom orthotics - will need new pair in 9-12 months looking at her current pair.  Consider strassburg sock, shockwave therapy.  F/u in 6 weeks or prn.

## 2023-03-07 ENCOUNTER — Telehealth: Payer: Self-pay

## 2023-03-07 NOTE — Telephone Encounter (Signed)
Needs appt for consult with me or TW

## 2023-03-07 NOTE — Telephone Encounter (Signed)
Pt LVM in triage line stating that she quit taking/using HRT patches about 1-2 yrs ago and wanted to talk to someone about getting back on them.   LAEX-05/31/2022-ML Next AEX-recall in placed for 05/2023, nothing scheduled right now.

## 2023-03-08 NOTE — Telephone Encounter (Signed)
Patient called & left another voicemail on triage line about getting hormone medication. Patient agreed to schedule consult appointment with Clearnce Hasten, NP to discuss.

## 2023-03-08 NOTE — Telephone Encounter (Signed)
Patient scheduled for HRT consult on 03/12/23 at 11am with Elmarie Shiley, NP.

## 2023-03-12 ENCOUNTER — Ambulatory Visit: Payer: 59 | Admitting: Nurse Practitioner

## 2023-03-13 ENCOUNTER — Ambulatory Visit: Payer: 59 | Admitting: Nurse Practitioner

## 2023-03-13 ENCOUNTER — Encounter: Payer: Self-pay | Admitting: Nurse Practitioner

## 2023-03-13 VITALS — BP 112/76 | HR 70 | Wt 132.0 lb

## 2023-03-13 DIAGNOSIS — Z79890 Hormone replacement therapy (postmenopausal): Secondary | ICD-10-CM | POA: Diagnosis not present

## 2023-03-13 DIAGNOSIS — R61 Generalized hyperhidrosis: Secondary | ICD-10-CM | POA: Diagnosis not present

## 2023-03-13 DIAGNOSIS — N951 Menopausal and female climacteric states: Secondary | ICD-10-CM | POA: Diagnosis not present

## 2023-03-13 MED ORDER — ESTRADIOL 0.025 MG/24HR TD PTWK: 0.0250 mg | MEDICATED_PATCH | TRANSDERMAL | 0 refills | Status: DC

## 2023-03-13 MED ORDER — PROGESTERONE MICRONIZED 100 MG PO CAPS: 100.0000 mg | ORAL_CAPSULE | Freq: Every day | ORAL | 0 refills | Status: DC

## 2023-03-13 NOTE — Progress Notes (Signed)
   Acute Office Visit  Subjective:    Patient ID: Tiffany Webb, female    DOB: 1970-10-15, 53 y.o.   MRN: [id number]   HPI 53 y.o. presents today to discuss restarting HRT. Discontinued in 2021 due to bleeding. Negative ultrasounds, benign EMB 10/2021 after episode of period-like bleeding. Was nervous to continue so she decided to stop use. No bleeding since. Symptoms were fine until the bleeding episode in 2023 where she had PMS symptoms and then hot flashes and mood changes returned.   Patient's last menstrual period was 07/29/2019.    Review of Systems  Constitutional: Negative.   Endocrine: Positive for heat intolerance.  Genitourinary: Negative.        Objective:    Physical Exam Constitutional:      Appearance: Normal appearance.   GU: Not indicated  BP 112/76 (BP Location: Right Arm, Patient Position: Sitting)   Pulse 70   Wt 132 lb (59.9 kg)   LMP 07/29/2019 Comment: sexually active  SpO2 98%   BMI 23.02 kg/m  Wt Readings from Last 3 Encounters:  03/13/23 132 lb (59.9 kg)  02/01/23 130 lb (59 kg)  05/31/22 134 lb (60.8 kg)         Assessment & Plan:   Problem List Items Addressed This Visit   None Visit Diagnoses       Postmenopausal hormone therapy    -  Primary   Relevant Medications   estradiol (CLIMARA) 0.025 mg/24hr patch   progesterone (PROMETRIUM) 100 MG capsule      Plan: Discussed HRT benefits and risks. Will restart estradiol patch and Prometrium. Educated on proper use.   Return if symptoms worsen or fail to improve.    Olivia Mackie DNP, 12:20 PM 03/13/2023

## 2023-04-08 ENCOUNTER — Other Ambulatory Visit: Payer: Self-pay | Admitting: Nurse Practitioner

## 2023-04-08 ENCOUNTER — Telehealth: Payer: Self-pay | Admitting: *Deleted

## 2023-04-08 DIAGNOSIS — Z79890 Hormone replacement therapy (postmenopausal): Secondary | ICD-10-CM

## 2023-04-08 MED ORDER — ESTRADIOL 0.0375 MG/24HR TD PTWK: 0.0375 mg | MEDICATED_PATCH | TRANSDERMAL | 0 refills | Status: DC

## 2023-04-08 NOTE — Telephone Encounter (Signed)
 Patient left message requesting to increase estradiol 0.25 mg. [facility].   Per review of EPIC, patient seen and started on HRT 03/13/23.   Spoke with patient. Patient reports continued Hot flashes and sleepless nights, improving but not resolved. Patient request to increase dosage. Confirmed also taking Prometrium 100 mg daily.   Advised I will forward update to Tiffany and our office will f/u with recommendations.

## 2023-04-08 NOTE — Telephone Encounter (Signed)
 Patient notified. Encounter closed

## 2023-04-08 NOTE — Telephone Encounter (Signed)
 New prescription sent for estradiol patch 0.037 mg.

## 2023-05-01 ENCOUNTER — Other Ambulatory Visit: Payer: Self-pay | Admitting: Obstetrics and Gynecology

## 2023-05-01 DIAGNOSIS — Z1231 Encounter for screening mammogram for malignant neoplasm of breast: Secondary | ICD-10-CM

## 2023-06-04 ENCOUNTER — Ambulatory Visit
Admission: RE | Admit: 2023-06-04 | Discharge: 2023-06-04 | Disposition: A | Discharge status: Home / Self Care | Source: Ambulatory Visit | Attending: Obstetrics and Gynecology | Admitting: Obstetrics and Gynecology

## 2023-06-04 DIAGNOSIS — Z1231 Encounter for screening mammogram for malignant neoplasm of breast: Secondary | ICD-10-CM | POA: Diagnosis not present

## 2023-06-09 ENCOUNTER — Encounter (HOSPITAL_COMMUNITY): Payer: Self-pay

## 2023-06-09 ENCOUNTER — Emergency Department (HOSPITAL_COMMUNITY)
Admission: EM | Admit: 2023-06-09 | Discharge: 2023-06-09 | Disposition: A | Discharge status: Home / Self Care | Attending: Emergency Medicine | Admitting: Emergency Medicine

## 2023-06-09 ENCOUNTER — Emergency Department (HOSPITAL_COMMUNITY)

## 2023-06-09 ENCOUNTER — Other Ambulatory Visit: Payer: Self-pay

## 2023-06-09 DIAGNOSIS — S52692A Other fracture of lower end of left ulna, initial encounter for closed fracture: Secondary | ICD-10-CM | POA: Diagnosis not present

## 2023-06-09 DIAGNOSIS — S52502A Unspecified fracture of the lower end of left radius, initial encounter for closed fracture: Secondary | ICD-10-CM | POA: Diagnosis not present

## 2023-06-09 DIAGNOSIS — S59912A Unspecified injury of left forearm, initial encounter: Secondary | ICD-10-CM | POA: Diagnosis present

## 2023-06-09 DIAGNOSIS — R001 Bradycardia, unspecified: Secondary | ICD-10-CM | POA: Diagnosis not present

## 2023-06-09 DIAGNOSIS — M545 Low back pain, unspecified: Secondary | ICD-10-CM | POA: Insufficient documentation

## 2023-06-09 DIAGNOSIS — S52602A Unspecified fracture of lower end of left ulna, initial encounter for closed fracture: Secondary | ICD-10-CM | POA: Insufficient documentation

## 2023-06-09 DIAGNOSIS — I700 Atherosclerosis of aorta: Secondary | ICD-10-CM | POA: Diagnosis not present

## 2023-06-09 DIAGNOSIS — Z7982 Long term (current) use of aspirin: Secondary | ICD-10-CM | POA: Diagnosis not present

## 2023-06-09 DIAGNOSIS — S52532A Colles' fracture of left radius, initial encounter for closed fracture: Secondary | ICD-10-CM | POA: Diagnosis not present

## 2023-06-09 DIAGNOSIS — R0602 Shortness of breath: Secondary | ICD-10-CM | POA: Diagnosis not present

## 2023-06-09 DIAGNOSIS — M546 Pain in thoracic spine: Secondary | ICD-10-CM | POA: Diagnosis not present

## 2023-06-09 DIAGNOSIS — W19XXXA Unspecified fall, initial encounter: Secondary | ICD-10-CM | POA: Diagnosis not present

## 2023-06-09 DIAGNOSIS — S52612A Displaced fracture of left ulna styloid process, initial encounter for closed fracture: Secondary | ICD-10-CM | POA: Diagnosis not present

## 2023-06-09 MED ORDER — METHOCARBAMOL 750 MG PO TABS: 750.0000 mg | ORAL_TABLET | Freq: Three times a day (TID) | ORAL | 0 refills | Status: DC | PRN

## 2023-06-09 MED ORDER — IBUPROFEN 800 MG PO TABS
800.0000 mg | ORAL_TABLET | Freq: Once | ORAL | Status: AC
  Administered 2023-06-09: 800 mg via ORAL
  Filled 2023-06-09: qty 1

## 2023-06-09 MED ORDER — ONDANSETRON 4 MG PO TBDP
4.0000 mg | ORAL_TABLET | Freq: Once | ORAL | Status: AC
  Administered 2023-06-09: 4 mg via ORAL
  Filled 2023-06-09: qty 1

## 2023-06-09 MED ORDER — OXYCODONE-ACETAMINOPHEN 5-325 MG PO TABS
1.0000 | ORAL_TABLET | Freq: Once | ORAL | Status: AC
  Administered 2023-06-09: 1 via ORAL
  Filled 2023-06-09: qty 1

## 2023-06-09 MED ORDER — OXYCODONE HCL 5 MG PO TABS
5.0000 mg | ORAL_TABLET | Freq: Once | ORAL | Status: AC
  Administered 2023-06-09: 5 mg via ORAL
  Filled 2023-06-09: qty 1

## 2023-06-09 MED ORDER — ALBUTEROL SULFATE HFA 108 (90 BASE) MCG/ACT IN AERS: 2.0000 | INHALATION_SPRAY | RESPIRATORY_TRACT | Status: DC | PRN

## 2023-06-09 MED ORDER — OXYCODONE HCL 5 MG PO TABS: 5.0000 mg | ORAL_TABLET | Freq: Four times a day (QID) | ORAL | 0 refills | Status: DC | PRN

## 2023-06-09 MED ORDER — METHOCARBAMOL 500 MG PO TABS
500.0000 mg | ORAL_TABLET | Freq: Once | ORAL | Status: AC
  Administered 2023-06-09: 500 mg via ORAL
  Filled 2023-06-09: qty 1

## 2023-06-09 NOTE — ED Triage Notes (Signed)
 Pt came in via POV d/t falling while roller skating, landing on her back & Lt wrist. Denies hitting her head, just endorses 10/10 pain in her Lt wrist & difficulty breathing ever since she landed on her back.

## 2023-06-09 NOTE — ED Notes (Signed)
Ortho tech called for splint placement. 

## 2023-06-09 NOTE — Discharge Instructions (Addendum)
 You were seen in the ED today for evaluation after fall.  You have a fracture to your left wrist.  Please call the orthopedic hand surgeon for close follow-up in 1 week.  You can take Tylenol  650 mg every 6 hours as needed for pain, ibuprofen  600 mg every 8 hours as needed for pain.  You can take oxycodone  for breakthrough pain, every 6 hours.  You can take Robaxin for muscle spasms. Please do not drive or operate heavy machinery if you take these meds. Of note, your x-ray did show an age-indeterminate fracture to your thoracic spine.  Please follow-up with your PCP about this.  Please return to the ED for any emergency medical symptoms.

## 2023-06-09 NOTE — Progress Notes (Signed)
 Orthopedic Tech Progress Note Patient Details:  Tiffany Webb 10/04/1970 [id number]  Ortho Devices Type of Ortho Device: Arm sling, Sugartong splint Ortho Device/Splint Location: lue Ortho Device/Splint Interventions: Ordered, Application, Adjustment  Before applying the splint I was able to remove one ring off of the patients finger, the second one wasn't able to be removed. After applying the splint I told the dr about the ring that was still on. Post Interventions Patient Tolerated: Well Instructions Provided: Care of device, Adjustment of device  Terryann Fiddler 06/09/2023, 9:18 PM

## 2023-06-09 NOTE — ED Notes (Signed)
 Awaiting patient from lobby.

## 2023-06-09 NOTE — ED Provider Notes (Signed)
 [place] [facility] AT [facility] Provider Note   CSN: [id number] Arrival date & time: 06/09/23  1612     History  Chief Complaint  Patient presents with   Fall   SOB   Wrist Pain   HPI  Tiffany Webb is a 53 y.o. female with no significant past medical history presents for evaluation after fall.  Patient states that she was rollerblading when she landed on her back and onto her left wrist.  She did not hit her head or lose consciousness.  She does not take any blood thinners.  She endorses pain to her left wrist as well as pain to the right side of her mid/upper back.  Denies any neck pain, or midline spinal pain.   Fall  Wrist Pain       Home Medications Prior to Admission medications   Medication Sig Start Date End Date Taking? Authorizing Provider  methocarbamol  (ROBAXIN -750) 750 MG tablet Take 1 tablet (750 mg total) by mouth every 8 (eight) hours as needed for up to 15 doses for muscle spasms. 06/09/23  Yes Lorain Robson, MD  oxyCODONE  (ROXICODONE ) 5 MG immediate release tablet Take 1 tablet (5 mg total) by mouth every 6 (six) hours as needed for severe pain (pain score 7-10). 06/09/23  Yes Lorain Robson, MD  aspirin-acetaminophen -caffeine (EXCEDRIN MIGRAINE) 250-250-65 MG tablet Take by mouth every 8 (eight) hours as needed for headache. Patient not taking: Reported on 03/13/2023    [provider]  Cholecalciferol 125 MCG (5000 UT) capsule 1 tablet Orally Once a day    [provider]  estradiol  (CLIMARA ) 0.0375 mg/24hr patch Place 1 patch (0.0375 mg total) onto the skin once a week. 04/08/23   Andee Bamberger, NP  ibuprofen  (ADVIL ) 200 MG tablet 2 tablets with food or milk Orally as directed    [provider]  Multiple Vitamin (MULTIVITAMIN PO) Take by mouth.    [provider]  Omega-3 Fatty Acids (OMEGA 3 PO) Take by mouth.    [provider]  OVER THE COUNTER MEDICATION Calcium-Magnesium-Zinc     [provider]  progesterone  (PROMETRIUM ) 100 MG capsule Take 1 capsule (100 mg total) by mouth daily. 03/13/23   Andee Bamberger, NP      Allergies    Patient has no known allergies.    Review of Systems   Review of Systems  Physical Exam Updated Vital Signs BP 131/71   Pulse 69   Temp 98.4 F (36.9 C) (Oral)   Resp 18   Ht 5\' 3"  (1.6 m)   Wt 61.2 kg   LMP 07/29/2019 Comment: sexually active  SpO2 100%   BMI 23.91 kg/m  Physical Exam Vitals and nursing note reviewed.  Constitutional:      General: She is not in acute distress.    Appearance: She is well-developed.  HENT:     Head: Normocephalic and atraumatic.     Right Ear: External ear normal.     Left Ear: External ear normal.     Nose: Nose normal.     Mouth/Throat:     Mouth: Mucous membranes are moist.  Eyes:     Conjunctiva/sclera: Conjunctivae normal.  Cardiovascular:     Rate and Rhythm: Normal rate and regular rhythm.     Pulses:          Radial pulses are 2+ on the right side and 2+ on the left side.     Heart sounds: No  murmur heard. Pulmonary:     Effort: Pulmonary effort is normal. No respiratory distress.     Breath sounds: Normal breath sounds. No wheezing or rales.  Chest:     Chest wall: No tenderness.  Abdominal:     Palpations: Abdomen is soft.     Tenderness: There is no abdominal tenderness.  Musculoskeletal:     Cervical back: Neck supple.     Comments: Left wrist with swelling, palpable 2+ radial pulse, pain with ROM, no overlying skin lacerations Pain to palpation of the right side of the mid thoracic back, no central thoracic spinal tenderness  Skin:    General: Skin is warm and dry.     Capillary Refill: Capillary refill takes less than 2 seconds.  Neurological:     Mental Status: She is alert.  Psychiatric:        Mood and Affect: Mood normal.     ED Results / Procedures / Treatments   Labs (all labs ordered are listed, but only abnormal results are  displayed) Labs Reviewed - No data to display  EKG None  Radiology DG Chest 2 View Result Date: 06/09/2023 CLINICAL DATA:  SOB EXAM: CHEST - 2 VIEW COMPARISON:  04/12/2008//. FINDINGS: Cardiac silhouette is unremarkable. No pneumothorax or pleural effusion. The lungs are clear. Aorta is calcified. There is midthoracic mild anterior wedge compression deformity which is new compared to the 20/10 study. IMPRESSION: No acute cardiopulmonary process. Age-indeterminate midthoracic compression deformity. Electronically Signed   By: Sydell Eva M.D.   On: 06/09/2023 19:31   DG Wrist Complete Left Result Date: 06/09/2023 CLINICAL DATA:  Fall. EXAM: LEFT WRIST - COMPLETE 3 VIEW COMPARISON:  None Available. FINDINGS: Comminuted intra-articular Colles fracture of the distal radius. Avulsion fracture of the ulnar styloid process. Fractures displaced by few mm. IMPRESSION: Distal radius and ulnar fractures. Electronically Signed   By: Sydell Eva M.D.   On: 06/09/2023 19:25    Procedures Procedures    Medications Ordered in ED Medications  albuterol  (VENTOLIN  HFA) 108 (90 Base) MCG/ACT inhaler 2 puff (has no administration in time range)  oxyCODONE -acetaminophen  (PERCOCET/ROXICET) 5-325 MG per tablet 1 tablet (1 tablet Oral Given 06/09/23 1737)  ondansetron  (ZOFRAN -ODT) disintegrating tablet 4 mg (4 mg Oral Given 06/09/23 1738)  ibuprofen  (ADVIL ) tablet 800 mg (800 mg Oral Given 06/09/23 2051)  oxyCODONE  (Oxy IR/ROXICODONE ) immediate release tablet 5 mg (5 mg Oral Given 06/09/23 2050)  methocarbamol  (ROBAXIN ) tablet 500 mg (500 mg Oral Given 06/09/23 2050)    ED Course/ Medical Decision Making/ A&P                                 Medical Decision Making Amount and/or Complexity of Data Reviewed Radiology: ordered.  Risk Prescription drug management.   Patient is alert, afebrile, hemodynamically stable in no acute distress.  Physical exam as noted above.  Great concern at this time is  fracture to the left wrist.  Differential also includes ligamentous injury or contusion.  In terms of patient's shortness of breath, will obtain chest x-ray to assess for any pneumothorax or obvious rib fractures.  EKG as well to check for any dysrhythmias.  In the interim, patient was given Percocet.  She later received Motrin , oxycodone , and Robaxin .  I personally interpreted patient's EKG, which demonstrated sinus bradycardia with rate of 56, normal intervals, no dysrhythmias or acute ischemic changes.   I personally interpreted patient's x-ray imaging:  Left wrist xr demonstrated distal ulnar and radius fracture to the left wrist.  CXR with no obvious pneumothorax or rib fractures.  Radiology appreciated midthoracic compression deformity, age-indeterminate.  Given no focal T-spine tenderness, low concern that this is acute.  Also spoke with patient about this finding, she states that she had prior injuries that she believes are responsible for this finding.    I spoke with Dr. Annamae Barrett with orthopedic hand surgery, agreeable to splint with outpatient follow-up in 1 week.  [employer] placed sugar-tong splint with no difficulty.  However, he noted that patient's right is difficult to remove from her ring finger.  He attempted removal with no success.  Upon speaking with the patient, she states that she has not removed her ring from the finger in several years.  Denies any pain to the area.  Finger is warm and well-perfused, good coloration, slightly swollen.  We discussed no need for emergent removal, but discussed signs of finger entrapment that would require presenting to the ED.  She is agreeable to this.  I reviewed workup with patient, and we discussed following up in 1 week with orthopedic surgery for evaluation.  In the interim, we discussed pain control with Tylenol  and ibuprofen , oxycodone  as needed for breakthrough pain.  She is aware she should not drive if she takes this medication.  She is  agreeable to this plan.  Strict return precautions were given, and patient was discharged in stable condition.  Patient seen in conjunction with Dr. Ranelle Buys, who agreed with the above work-up and plan of care.        Final Clinical Impression(s) / ED Diagnoses Final diagnoses:  Fall, initial encounter  Closed fracture of distal ends of left radius and ulna, initial encounter    Rx / DC Orders ED Discharge Orders          Ordered    oxyCODONE  (ROXICODONE ) 5 MG immediate release tablet  Every 6 hours PRN        06/09/23 2121    methocarbamol  (ROBAXIN -750) 750 MG tablet  Every 8 hours PRN        06/09/23 2121              Lorain Robson, MD 06/09/23 2315    Sallyanne Creamer, DO 06/17/23 1132

## 2023-06-10 ENCOUNTER — Other Ambulatory Visit: Payer: Self-pay | Admitting: Nurse Practitioner

## 2023-06-10 DIAGNOSIS — Z79890 Hormone replacement therapy (postmenopausal): Secondary | ICD-10-CM

## 2023-06-10 NOTE — Telephone Encounter (Signed)
 Med refill request: progesterone   Last AEX: 05/31/22 last OV 03/13/23 Next AEX: 08/06/23 Last MMG (if hormonal med) 06/04/23 birads cat 1 neg  Refill authorized: Last rx 03/13/23 #90 with 0 refills. Please approve or deny

## 2023-06-11 ENCOUNTER — Encounter: Payer: Self-pay | Admitting: Obstetrics and Gynecology

## 2023-06-11 DIAGNOSIS — M25532 Pain in left wrist: Secondary | ICD-10-CM | POA: Diagnosis not present

## 2023-06-12 DIAGNOSIS — Y999 Unspecified external cause status: Secondary | ICD-10-CM | POA: Diagnosis not present

## 2023-06-12 DIAGNOSIS — S52572A Other intraarticular fracture of lower end of left radius, initial encounter for closed fracture: Secondary | ICD-10-CM | POA: Diagnosis not present

## 2023-06-12 DIAGNOSIS — G8918 Other acute postprocedural pain: Secondary | ICD-10-CM | POA: Diagnosis not present

## 2023-06-12 DIAGNOSIS — X58XXXA Exposure to other specified factors, initial encounter: Secondary | ICD-10-CM | POA: Diagnosis not present

## 2023-06-12 HISTORY — PX: WRIST SURGERY: SHX841

## 2023-06-21 DIAGNOSIS — S52572A Other intraarticular fracture of lower end of left radius, initial encounter for closed fracture: Secondary | ICD-10-CM | POA: Diagnosis not present

## 2023-07-02 ENCOUNTER — Other Ambulatory Visit: Payer: Self-pay | Admitting: Nurse Practitioner

## 2023-07-02 DIAGNOSIS — Z79890 Hormone replacement therapy (postmenopausal): Secondary | ICD-10-CM

## 2023-07-03 NOTE — Telephone Encounter (Signed)
 Med refill request: estradiol  0.0375 mg patch weekly Last AEX: 05/31/22 -TW Next AEX: 08/06/23 -BS Last MMG (if hormonal med) 06/04/23 BiRads 1 neg  Last Rx sent 04/08/23 #12/0RF  Rx pended #4/0RF  Refill authorized: Please Advise?

## 2023-07-05 DIAGNOSIS — M25532 Pain in left wrist: Secondary | ICD-10-CM | POA: Diagnosis not present

## 2023-07-05 DIAGNOSIS — S52572D Other intraarticular fracture of lower end of left radius, subsequent encounter for closed fracture with routine healing: Secondary | ICD-10-CM | POA: Diagnosis not present

## 2023-07-10 DIAGNOSIS — M25532 Pain in left wrist: Secondary | ICD-10-CM | POA: Diagnosis not present

## 2023-07-15 DIAGNOSIS — M25532 Pain in left wrist: Secondary | ICD-10-CM | POA: Diagnosis not present

## 2023-07-25 DIAGNOSIS — M25532 Pain in left wrist: Secondary | ICD-10-CM | POA: Diagnosis not present

## 2023-07-29 DIAGNOSIS — M25532 Pain in left wrist: Secondary | ICD-10-CM | POA: Diagnosis not present

## 2023-08-02 DIAGNOSIS — S52572D Other intraarticular fracture of lower end of left radius, subsequent encounter for closed fracture with routine healing: Secondary | ICD-10-CM | POA: Diagnosis not present

## 2023-08-06 ENCOUNTER — Ambulatory Visit (INDEPENDENT_AMBULATORY_CARE_PROVIDER_SITE_OTHER): Payer: 59 | Admitting: Obstetrics and Gynecology

## 2023-08-06 ENCOUNTER — Other Ambulatory Visit (HOSPITAL_COMMUNITY)
Admission: RE | Admit: 2023-08-06 | Discharge: 2023-08-06 | Disposition: A | Discharge status: Home / Self Care | Source: Ambulatory Visit | Attending: Obstetrics and Gynecology | Admitting: Obstetrics and Gynecology

## 2023-08-06 ENCOUNTER — Encounter: Payer: Self-pay | Admitting: Obstetrics and Gynecology

## 2023-08-06 VITALS — BP 116/74 | HR 66 | Ht 64.75 in | Wt 132.0 lb

## 2023-08-06 DIAGNOSIS — Z124 Encounter for screening for malignant neoplasm of cervix: Secondary | ICD-10-CM | POA: Insufficient documentation

## 2023-08-06 DIAGNOSIS — Z01419 Encounter for gynecological examination (general) (routine) without abnormal findings: Secondary | ICD-10-CM

## 2023-08-06 DIAGNOSIS — Z79890 Hormone replacement therapy (postmenopausal): Secondary | ICD-10-CM | POA: Diagnosis not present

## 2023-08-06 DIAGNOSIS — Z1331 Encounter for screening for depression: Secondary | ICD-10-CM

## 2023-08-06 MED ORDER — PROGESTERONE MICRONIZED 100 MG PO CAPS: 100.0000 mg | ORAL_CAPSULE | Freq: Every day | ORAL | 3 refills | Status: AC

## 2023-08-06 MED ORDER — ESTRADIOL 0.0375 MG/24HR TD PTTW: 1.0000 | MEDICATED_PATCH | TRANSDERMAL | 3 refills | Status: AC

## 2023-08-06 NOTE — Progress Notes (Unsigned)
 53 y.o. [id number] Married Caucasian female here for annual exam.    On HRT.  It is helping hot flashes and irritability.  Asking about twice weekly patch.   States she has an overactive bladder.  She talks herself into voiding often.  Voids for a couple of times before bed.  During day can wait to void every 3 - 4 hours.  Occasional urinary incontinence with sneeze and cough.    Fell hard on concrete floor while roller skating and broke her left wrist.  Had surgery and doing physical therapy.   Son is 3 yo.   PCP: Chrystal Lamarr RAMAN, MD   Patient's last menstrual period was 07/29/2019.           Sexually active: Yes.    The current method of family planning is post menopausal status.    Menopausal hormone therapy:  Climara  & prometrium   Exercising: Yes.    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  Normal.   Bone Density:  n/a  Result  n/a   Immunization History  Administered Date(s) Administered   Influenza,inj,Quad PF,6+ Mos 11/09/2021   Tdap 04/29/2012      reports that she has never smoked. She has never used smokeless tobacco. She reports current alcohol use. She reports that she does not use drugs.  Past Medical History:  Diagnosis Date   Abnormal Pap smear of cervix    AMA (advanced maternal age) multigravida 35+    Gonadotropin resistant ovary syndrome    Hx of Bell's palsy    several months ago   Hypothyroidism     Menstrual migraine 08/11/2015   Migraines    No pertinent past medical history    PONV (postoperative nausea and vomiting)     Past Surgical History:  Procedure Laterality Date   DILATION AND EVACUATION  01/08/2011   Procedure: DILATATION AND EVACUATION (D&E) 2ND TRIMESTER;  Surgeon: Marie-Lyne Lavoie;  Location: [facility];  Service: Gynecology;  Laterality: N/A;   GYNECOLOGIC CRYOSURGERY     WISDOM TOOTH EXTRACTION     WRIST SURGERY  06/12/2023    Current Outpatient Medications  Medication Sig Dispense Refill   Ascorbic Acid (VITAMIN C PO)      BIOTIN PO      Cholecalciferol 125 MCG (5000 UT) capsule 1 tablet Orally Once a day     estradiol  (CLIMARA  - DOSED IN MG/24 HR) 0.0375 mg/24hr patch PLACE 1 PATCH ONTO THE SKIN ONCE WEEKLY 4 patch 1   ibuprofen  (ADVIL ) 200 MG tablet 2 tablets with food or milk Orally as directed     OVER THE COUNTER MEDICATION Calcium-Magnesium-Zinc     progesterone  (PROMETRIUM ) 100 MG capsule TAKE 1  CAPSULE BY MOUTH DAILY 90 capsule 0   Pyridoxine HCl (VITAMIN B6 PO)      aspirin-acetaminophen -caffeine (EXCEDRIN MIGRAINE) 250-250-65 MG tablet Take by mouth every 8 (eight) hours as needed for headache. (Patient not taking: Reported on 08/06/2023)     methocarbamol  (ROBAXIN -750) 750 MG tablet Take 1 tablet (750 mg total) by mouth every 8 (eight) hours as needed for up to 15 doses for muscle spasms. (Patient not taking: Reported on 08/06/2023) 15 tablet 0   Multiple Vitamin (MULTIVITAMIN PO) Take by mouth. (Patient not taking: Reported on 08/06/2023)     Omega-3 Fatty Acids (OMEGA 3 PO) Take by mouth. (Patient not taking: Reported on 08/06/2023)     oxyCODONE  (ROXICODONE ) 5 MG immediate release tablet Take 1 tablet (5 mg total) by mouth every 6 (six) hours as needed for severe pain (pain score 7-10). (Patient not taking: Reported on 08/06/2023) 12 tablet 0   No current facility-administered medications for this visit.    ALLERGIES: Patient has no known  allergies.  Family History  Problem Relation Age of Onset   Osteopenia Mother    Asthma Father    Yvone' disease Sister     Review of Systems  All other systems reviewed and are negative.   PHYSICAL EXAM:  BP 116/74 (BP Location: Left Arm, Patient Position: Sitting)   Pulse 66   Ht 5' 4.75 (1.645 m)   Wt 132 lb (59.9 kg)   LMP 07/29/2019 Comment: sexually active  SpO2 98%   BMI 22.14 kg/m     General appearance: alert, cooperative and appears stated age Head: normocephalic, without obvious abnormality, atraumatic Neck: no adenopathy, supple, symmetrical, trachea midline and thyroid  normal to inspection and palpation Lungs: clear to auscultation bilaterally Breasts: normal appearance, no masses or tenderness, No nipple retraction or dimpling, No nipple discharge or bleeding, No axillary adenopathy Heart: regular rate and rhythm Abdomen: soft, non-tender; no masses, no organomegaly Extremities: extremities normal, atraumatic, no cyanosis or edema Skin: skin color, texture, turgor normal. No rashes or lesions Lymph nodes: cervical, supraclavicular, and axillary nodes normal. Neurologic: grossly normal  Pelvic: External genitalia:  no lesions              No abnormal inguinal nodes palpated.              Urethra:  normal appearing urethra with no masses, tenderness or lesions              Bartholins and Skenes: normal                 Vagina: normal appearing vagina with normal color and discharge, no lesions              Cervix: no lesions              Pap taken: yes Bimanual Exam:  Uterus:  normal size, contour, position, consistency, mobility, non-tender              Adnexa: no mass, fullness, tenderness              Rectal exam: yes.  Confirms.              Anus:  normal sphincter tone, no lesions  Chaperone was present for exam:  Kari HERO, CMA  ASSESSMENT: Well woman visit with gynecologic exam. HRT. Cervical cancer screening.  PHQ-2-9: 0 Wrist fracture from  trauma.   PLAN: Mammogram screening discussed. Self breast awareness reviewed. Pap and HRV collected:  yes Guidelines for  Calcium, Vitamin D , regular exercise program including cardiovascular and weight bearing exercise. Medication refills:  Vivelle  Dot 0.0375 mg twice weekly, Prometrium  100 mg q hs.  Discused WHI and use of HRT which can increase risk of PE, DVT, MI, stroke and breast cancer.  Benefits of reducing risk of osteoporosis and colon cancer also reviewed. She will let me know if she wants to do fasting labs here.  Follow up:  yearly and prn.    Addendum:  Chart review after completion of her visit.  Her CXR 06/09/23 following her fall shows a midthoracic mild anterior wedge compression deformity, age indeterminate.   I will recommend she do a bone density study.

## 2023-08-06 NOTE — Patient Instructions (Signed)

## 2023-08-07 ENCOUNTER — Telehealth: Payer: Self-pay | Admitting: Obstetrics and Gynecology

## 2023-08-07 DIAGNOSIS — Z780 Asymptomatic menopausal state: Secondary | ICD-10-CM

## 2023-08-07 DIAGNOSIS — Z8781 Personal history of (healed) traumatic fracture: Secondary | ICD-10-CM

## 2023-08-07 LAB — CYTOLOGY - PAP
Comment: NEGATIVE
Diagnosis: NEGATIVE
High risk HPV: NEGATIVE

## 2023-08-07 NOTE — Telephone Encounter (Signed)
 Please let patient know that I recommend she has a bone density.  I will place an order for [facility].   She had a recent fall and fractured her left wrist.   When I reviewed her chart after her visit, I see she had a chest x-ray this April that showed a compression deformity of her thoracic spine, the age of which could not be determined.

## 2023-08-08 ENCOUNTER — Ambulatory Visit: Payer: Self-pay | Admitting: Obstetrics and Gynecology

## 2023-08-08 NOTE — Telephone Encounter (Signed)
 Spoke with patient, advised as seen below per Dr. Nikki. Patient verbalizes understanding and is agreeable. Number provided for scheduling.   Patient also notified of 08/06/23 pap results.   Routing to provider for final review. Patient is agreeable to disposition. Will close encounter.

## 2023-08-12 DIAGNOSIS — M25532 Pain in left wrist: Secondary | ICD-10-CM | POA: Diagnosis not present

## 2023-08-15 DIAGNOSIS — M25532 Pain in left wrist: Secondary | ICD-10-CM | POA: Diagnosis not present

## 2023-08-20 DIAGNOSIS — M25532 Pain in left wrist: Secondary | ICD-10-CM | POA: Diagnosis not present

## 2023-08-23 DIAGNOSIS — M25532 Pain in left wrist: Secondary | ICD-10-CM | POA: Diagnosis not present

## 2023-08-27 DIAGNOSIS — M25532 Pain in left wrist: Secondary | ICD-10-CM | POA: Diagnosis not present

## 2023-08-28 ENCOUNTER — Telehealth: Payer: Self-pay

## 2023-08-28 DIAGNOSIS — Z780 Asymptomatic menopausal state: Secondary | ICD-10-CM

## 2023-08-28 NOTE — Telephone Encounter (Signed)
 Rx declined.  Already sent in on 08/08/23.

## 2023-08-28 NOTE — Telephone Encounter (Signed)
 Error

## 2023-08-28 NOTE — Telephone Encounter (Signed)
.  Med refill request: Estradiol  0.0375MG  Patch  Last AEX:08/06/23 Next AEX:08/06/24 Last MMG (if hormonal med)06/04/23 BI-Rads 1 Negative  Refill authorized: Please Advise?   Refill denied rx sent 08/07/23 QTY 24 with 3 refills.

## 2023-09-03 ENCOUNTER — Ambulatory Visit (HOSPITAL_BASED_OUTPATIENT_CLINIC_OR_DEPARTMENT_OTHER)
Admission: RE | Admit: 2023-09-03 | Discharge: 2023-09-03 | Disposition: A | Discharge status: Home / Self Care | Source: Ambulatory Visit | Attending: Obstetrics and Gynecology | Admitting: Obstetrics and Gynecology

## 2023-09-03 ENCOUNTER — Ambulatory Visit: Payer: Self-pay | Admitting: Radiology

## 2023-09-03 DIAGNOSIS — Z8781 Personal history of (healed) traumatic fracture: Secondary | ICD-10-CM | POA: Diagnosis not present

## 2023-09-03 DIAGNOSIS — Z780 Asymptomatic menopausal state: Secondary | ICD-10-CM | POA: Diagnosis not present

## 2023-09-03 NOTE — Telephone Encounter (Signed)
 Spoke to patient about her Dexa results. Scheduled patient for soonest appointment September 10 and added her to wait list for call if there is a cancellation. Also offered for patient to see Annabella Shutter for sooner appointment. She refused. The patient is requesting advice on what to do in the mean time while she waits for her appointment.

## 2023-09-04 DIAGNOSIS — M25532 Pain in left wrist: Secondary | ICD-10-CM | POA: Diagnosis not present

## 2023-09-12 ENCOUNTER — Other Ambulatory Visit: Payer: Self-pay | Admitting: Obstetrics and Gynecology

## 2023-09-12 DIAGNOSIS — M810 Age-related osteoporosis without current pathological fracture: Secondary | ICD-10-CM

## 2023-09-12 NOTE — Progress Notes (Signed)
 Future orders for PTH, BMP, ph, vit D.

## 2023-09-12 NOTE — Progress Notes (Signed)
 Future orders for PTH, BMP, vit D, phosphorus.

## 2023-09-13 DIAGNOSIS — M25532 Pain in left wrist: Secondary | ICD-10-CM | POA: Diagnosis not present

## 2023-09-13 DIAGNOSIS — S52572D Other intraarticular fracture of lower end of left radius, subsequent encounter for closed fracture with routine healing: Secondary | ICD-10-CM | POA: Diagnosis not present

## 2023-09-13 DIAGNOSIS — S52572A Other intraarticular fracture of lower end of left radius, initial encounter for closed fracture: Secondary | ICD-10-CM | POA: Diagnosis not present

## 2023-09-16 ENCOUNTER — Other Ambulatory Visit: Payer: Self-pay | Admitting: Obstetrics and Gynecology

## 2023-09-16 DIAGNOSIS — Z0189 Encounter for other specified special examinations: Secondary | ICD-10-CM

## 2023-09-16 NOTE — Progress Notes (Signed)
 Routine future labs:  CBC, TSH, CMP, lipids.

## 2023-09-19 DIAGNOSIS — M25532 Pain in left wrist: Secondary | ICD-10-CM | POA: Diagnosis not present

## 2023-09-27 DIAGNOSIS — M25532 Pain in left wrist: Secondary | ICD-10-CM | POA: Diagnosis not present

## 2023-09-27 IMAGING — MG MM DIGITAL SCREENING BILAT W/ TOMO AND CAD
8 series · 9 of 24 positions shown · non-contrast
Comparison: Previous exam(s).

CLINICAL DATA: Screening.

EXAM:
DIGITAL SCREENING BILATERAL MAMMOGRAM WITH TOMOSYNTHESIS AND CAD
TECHNIQUE: Bilateral screening digital craniocaudal and mediolateral oblique
mammograms were obtained. Bilateral screening digital breast
tomosynthesis was performed. The images were evaluated with
computer-aided detection.

[R CC synth-2D]
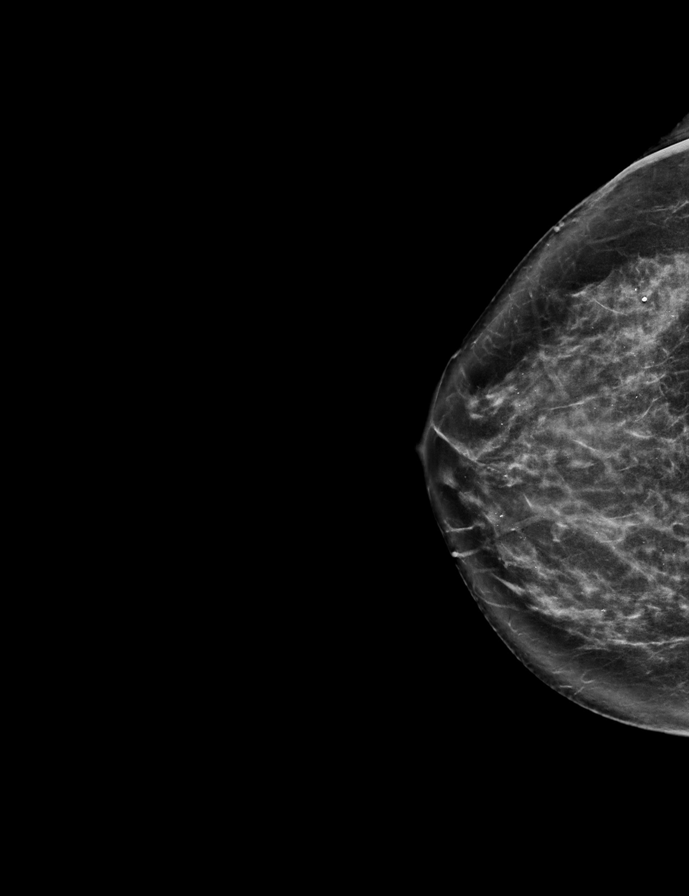

[R MLO synth-2D]
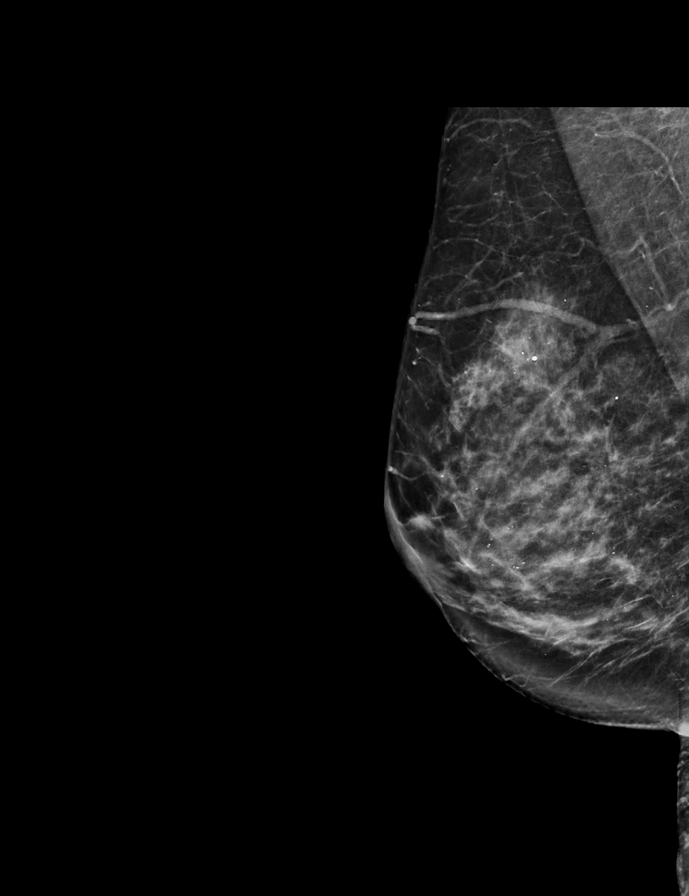

[L CC synth-2D]
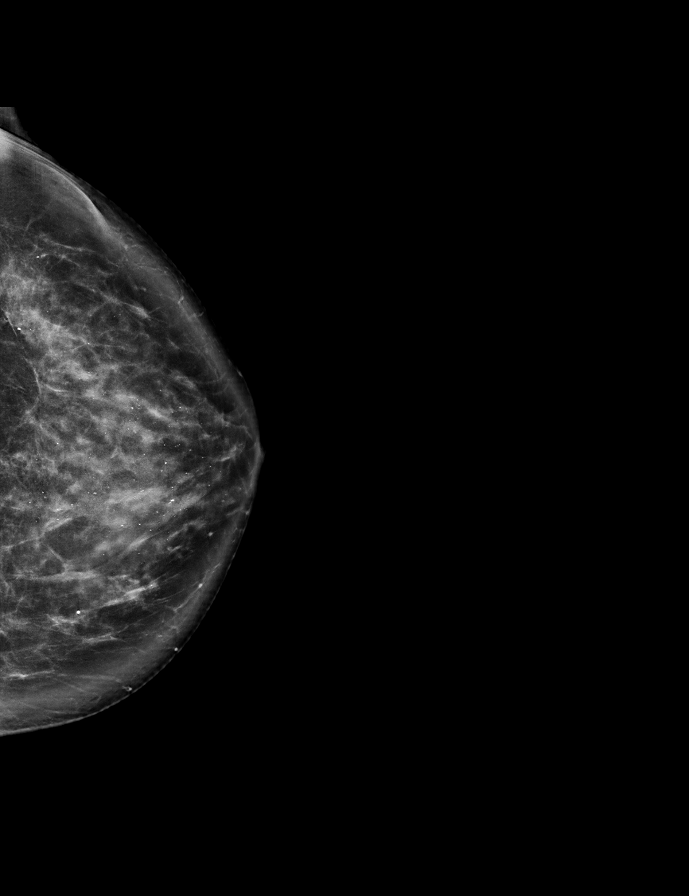

[L MLO synth-2D]
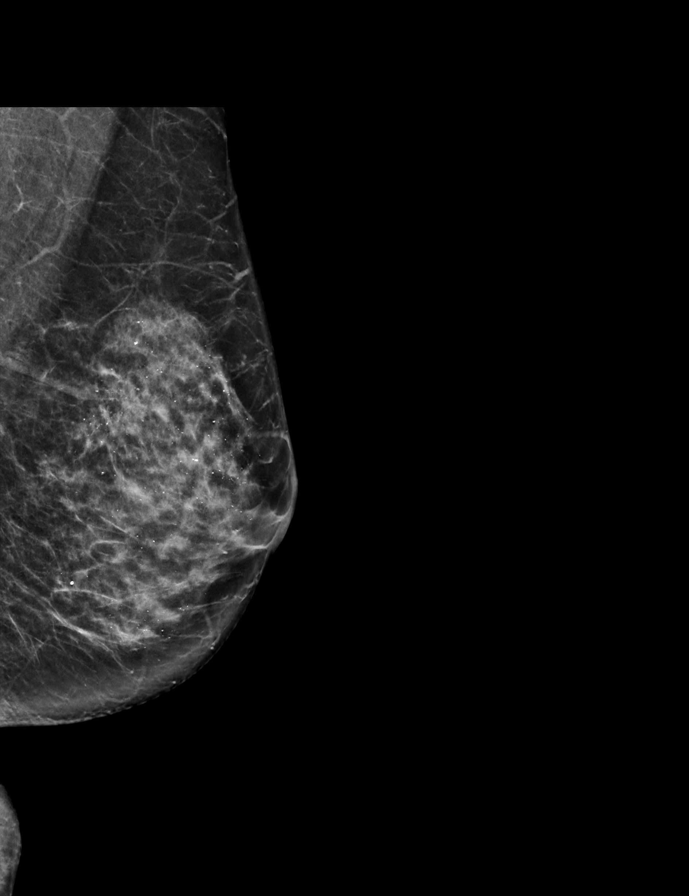

[L MLO tomo · 2 of 68 frames shown]
[frame 22/68]
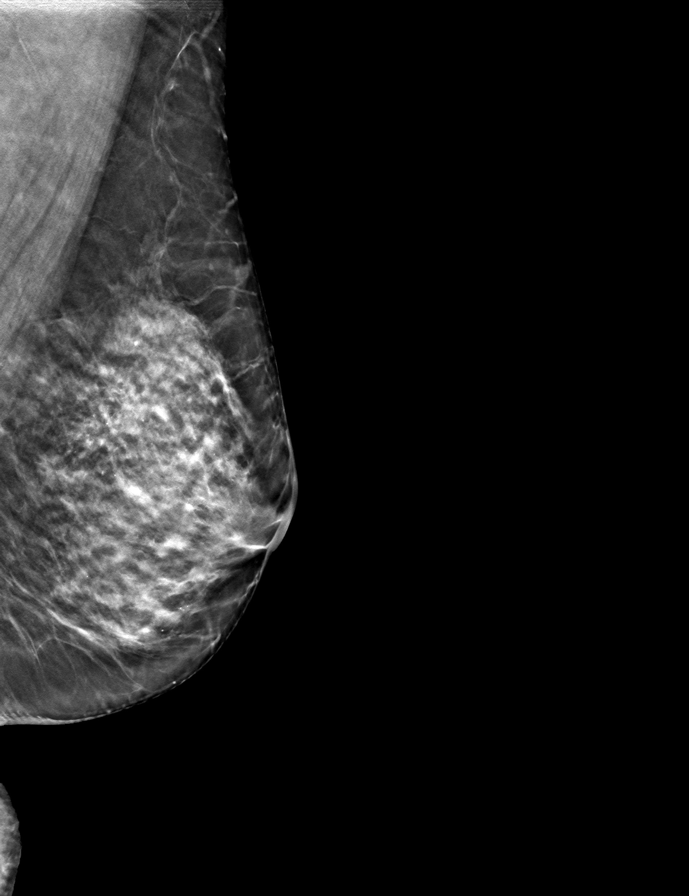
[frame 35/68]
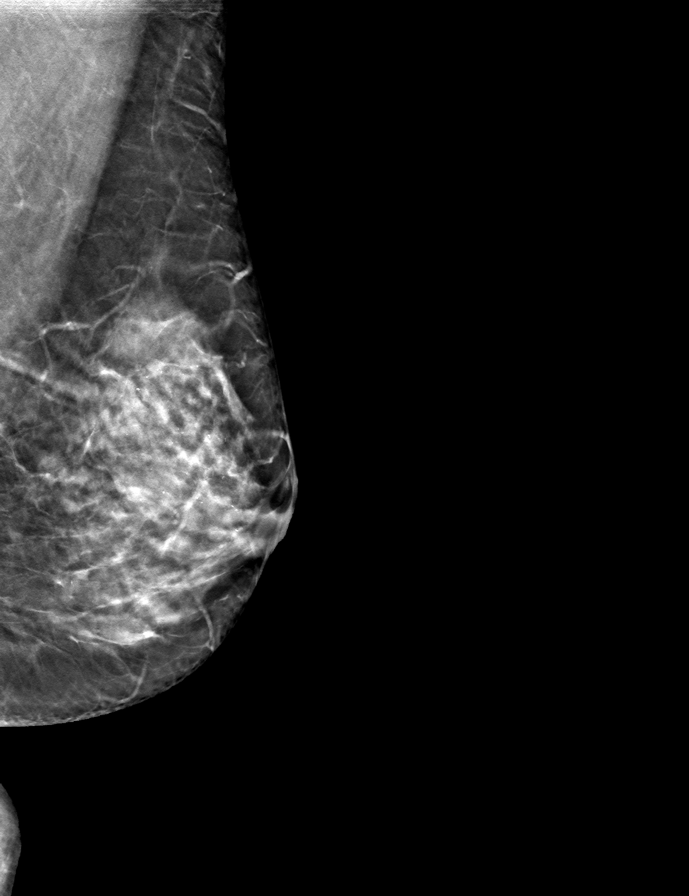

[L CC tomo · tomo slice 39/78.0]
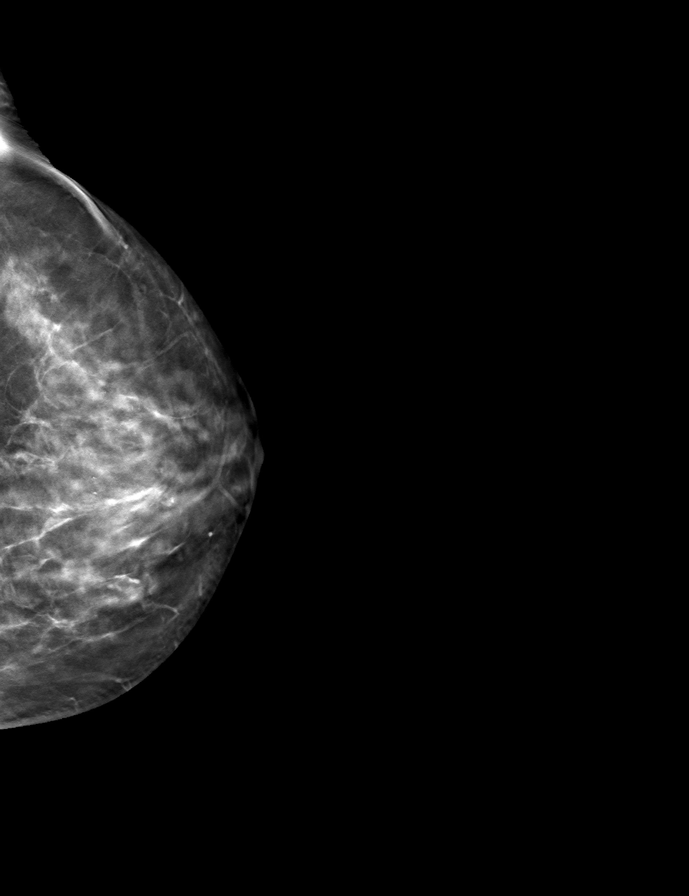

[R CC tomo · tomo slice 35/69.0]
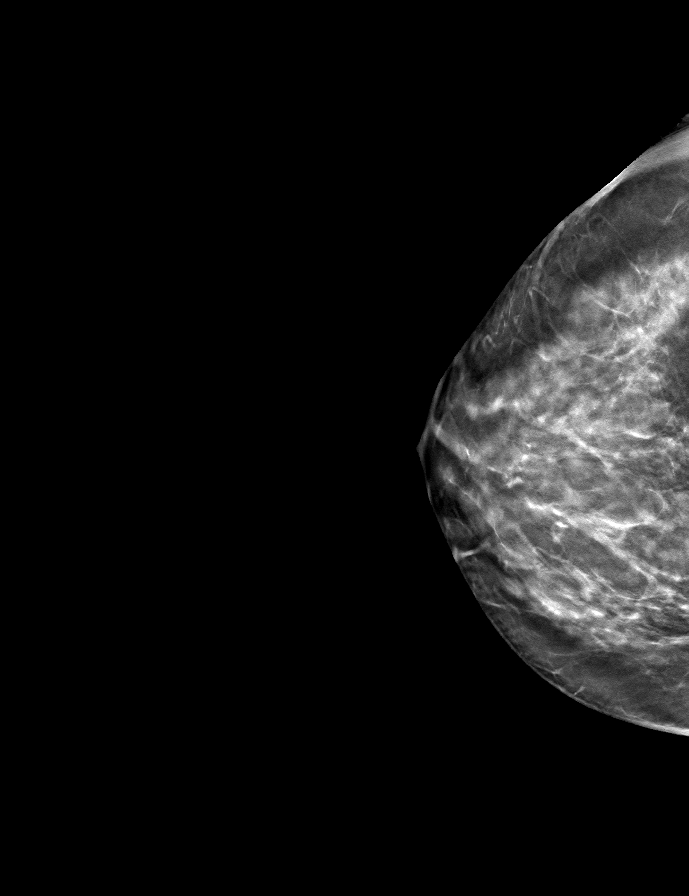

[R MLO tomo · tomo slice 35/68.0]
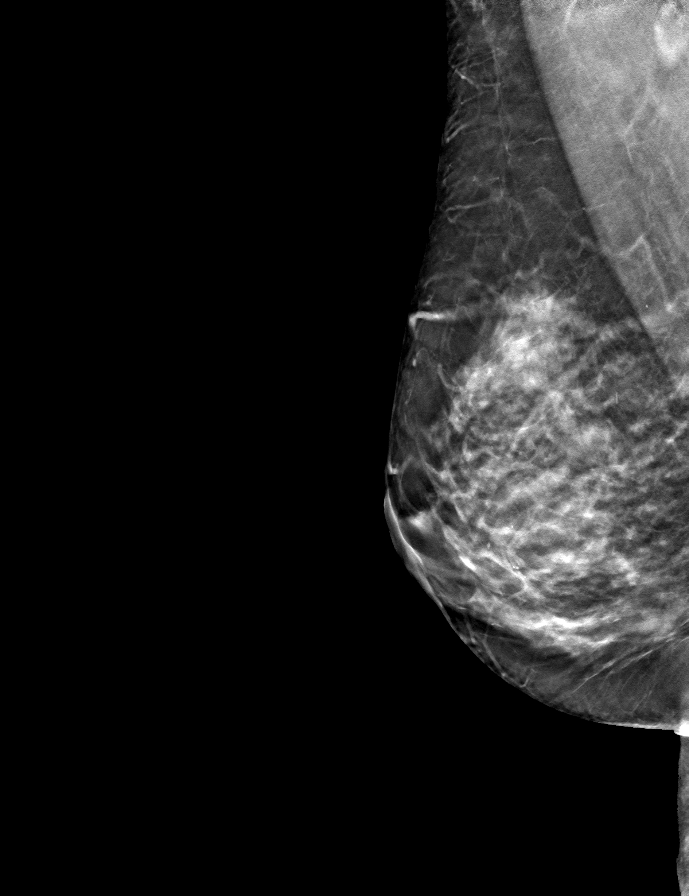

[9 of 24 positions shown; findings below may reference images not displayed]

ACR Breast Density Category d: The breast tissue is extremely dense,
which lowers the sensitivity of mammography
FINDINGS: There are no findings suspicious for malignancy.
IMPRESSION: No mammographic evidence of malignancy. A result letter of this
screening mammogram will be mailed directly to the patient.

RECOMMENDATION:
Screening mammogram in one year. (Code:TA-V-WV9)

BI-RADS CATEGORY  1: Negative.

## 2023-10-01 ENCOUNTER — Other Ambulatory Visit

## 2023-10-01 ENCOUNTER — Telehealth: Payer: Self-pay

## 2023-10-01 NOTE — Telephone Encounter (Signed)
 She should stop taking the biotin for one week prior to having her blood work done. Yes, it can interfere with thyroid  function testing.

## 2023-10-01 NOTE — Telephone Encounter (Signed)
 Patient aware and voiced understanding

## 2023-10-01 NOTE — Telephone Encounter (Signed)
 Patient left a message on the triage line stating that she is scheduled next Wednesday to get some labs done. She states that she has been taking biotin and has read that it can interfere with lab results especially thyroid . She would like your advise on when she should stop taking the biotin supplement before her labs next Wednesday.

## 2023-10-04 DIAGNOSIS — M25532 Pain in left wrist: Secondary | ICD-10-CM | POA: Diagnosis not present

## 2023-10-09 ENCOUNTER — Other Ambulatory Visit

## 2023-10-11 ENCOUNTER — Other Ambulatory Visit

## 2023-10-11 DIAGNOSIS — M25532 Pain in left wrist: Secondary | ICD-10-CM | POA: Diagnosis not present

## 2023-10-16 ENCOUNTER — Other Ambulatory Visit

## 2023-10-16 DIAGNOSIS — Z0189 Encounter for other specified special examinations: Secondary | ICD-10-CM

## 2023-10-16 DIAGNOSIS — M810 Age-related osteoporosis without current pathological fracture: Secondary | ICD-10-CM | POA: Diagnosis not present

## 2023-10-17 DIAGNOSIS — M25532 Pain in left wrist: Secondary | ICD-10-CM | POA: Diagnosis not present

## 2023-10-17 LAB — CBC
HCT: 37.7 % (ref 35.0–45.0)
Hemoglobin: 12 g/dL (ref 11.7–15.5)
MCH: 29.9 pg (ref 27.0–33.0)
MCHC: 31.8 g/dL — ABNORMAL LOW (ref 32.0–36.0)
MCV: 94 fL (ref 80.0–100.0)
MPV: 10.1 fL (ref 7.5–12.5)
Platelets: 172 Thousand/uL (ref 140–400)
RBC: 4.01 Million/uL (ref 3.80–5.10)
RDW: 12.9 % (ref 11.0–15.0)
WBC: 4.5 Thousand/uL (ref 3.8–10.8)

## 2023-10-17 LAB — COMPREHENSIVE METABOLIC PANEL WITH GFR
AG Ratio: 1.8 (calc) (ref 1.0–2.5)
ALT: 22 U/L (ref 6–29)
AST: 25 U/L (ref 10–35)
Albumin: 4.5 g/dL (ref 3.6–5.1)
Alkaline phosphatase (APISO): 66 U/L (ref 37–153)
BUN: 14 mg/dL (ref 7–25)
CO2: 25 mmol/L (ref 20–32)
Calcium: 9.2 mg/dL (ref 8.6–10.4)
Chloride: 104 mmol/L (ref 98–110)
Creat: 0.63 mg/dL (ref 0.50–1.03)
Globulin: 2.5 g/dL (ref 1.9–3.7)
Glucose, Bld: 87 mg/dL (ref 65–99)
Potassium: 4 mmol/L (ref 3.5–5.3)
Sodium: 139 mmol/L (ref 135–146)
Total Bilirubin: 0.4 mg/dL (ref 0.2–1.2)
Total Protein: 7 g/dL (ref 6.1–8.1)
eGFR: 105 mL/min/1.73m2 (ref >=60)

## 2023-10-17 LAB — PHOSPHORUS: Phosphorus: 3.8 mg/dL (ref 2.5–4.5)

## 2023-10-17 LAB — LIPID PANEL
Cholesterol: 209 mg/dL — ABNORMAL HIGH (ref <200)
HDL: 82 mg/dL (ref >=50)
LDL Cholesterol (Calc): 112 mg/dL — ABNORMAL HIGH
Non-HDL Cholesterol (Calc): 127 mg/dL (ref <130)
Total CHOL/HDL Ratio: 2.5 (calc) (ref <5.0)
Triglycerides: 66 mg/dL (ref <150)

## 2023-10-17 LAB — VITAMIN D 25 HYDROXY (VIT D DEFICIENCY, FRACTURES): Vit D, 25-Hydroxy: 37 ng/mL (ref 30–100)

## 2023-10-17 LAB — PARATHYROID HORMONE, INTACT (NO CA): PTH: 70 pg/mL (ref 16–77)

## 2023-10-17 LAB — TSH: TSH: 2.95 m[IU]/L

## 2023-10-18 ENCOUNTER — Ambulatory Visit (HOSPITAL_BASED_OUTPATIENT_CLINIC_OR_DEPARTMENT_OTHER): Payer: Self-pay | Admitting: Obstetrics and Gynecology

## 2023-10-18 NOTE — Progress Notes (Unsigned)
 GYNECOLOGY  VISIT   HPI: 53 y.o.   Married  Caucasian female   [phone] with Patient's last menstrual period was 07/29/2019.   here for: Discuss dexa results - 09/03/23 Result: Osteoporosis of spine and hips.    Hx wrist and ulnar fracture from trauma.   Hx CXR - 06/09/23 following a fall shows a midthoracic mild anterior wedge compression deformity, age indeterminate.   She recalls a hx of falling out of a bed and another fall and had back pain following these occasions.    Normal PTH, Vit D, CMP 10/16/23.  On HRT.  Hx dental reabsorption issue.  She saw an endodontist and had a crown placement.  No invasive dental procedures for a couple of years.   GYNECOLOGIC HISTORY: Patient's last menstrual period was 07/29/2019. Contraception:  PMP Menopausal hormone therapy:  Vivelle  & progesterone   Last 2 paps:  08/06/23 neg HR HPV neg, 04/20/21 neg  History of abnormal Pap or positive HPV:  no Mammogram:  06/04/23 Breast Density Cat D, BIRADS Cat 1 neg         OB History     Gravida  3   Para  1   Term  1   Preterm  0   AB  2   Living  1      SAB  1   IAB  1   Ectopic  0   Multiple  0   Live Births  1              Patient Active Problem List   Diagnosis Date Noted   Menstrual migraine 08/11/2015   Family history of thyroid  disease 12/15/2013   High serum thyroid  stimulating hormone (TSH) 12/15/2013   History of dilatation and curettage 12/15/2013   History of infertility 12/15/2013   Recurrent pregnancy loss without current pregnancy 12/15/2013   SVD (spontaneous vaginal delivery) 07/15/2012   Postpartum care following vaginal delivery (6/3) 07/15/2012   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 59/24/2013   AMA (advanced maternal age) multigravida 35+ 12/21/2010   Abnormal first trimester screen 12/21/2010    Past Medical History:  Diagnosis Date   Abnormal Pap smear of cervix    AMA (advanced maternal age) multigravida 35+    Gonadotropin  resistant ovary syndrome    Hx of Bell's palsy    several months ago   Hypothyroidism    Menstrual migraine 08/11/2015   Migraines    No pertinent past medical history    PONV (postoperative nausea and vomiting)     Past Surgical History:  Procedure Laterality Date   DILATION AND EVACUATION  01/08/2011   Procedure: DILATATION AND EVACUATION (D&E) 2ND TRIMESTER;  Surgeon: Marie-Lyne Lavoie;  Location: [facility];  Service: Gynecology;  Laterality: N/A;   GYNECOLOGIC CRYOSURGERY     WISDOM TOOTH EXTRACTION     WRIST SURGERY  06/12/2023    Current Outpatient Medications  Medication Sig Dispense Refill   Ascorbic Acid (VITAMIN C PO)      BIOTIN PO      Cholecalciferol 125 MCG (5000 UT) capsule 1 tablet Orally Once a day     estradiol  (VIVELLE -DOT) 0.0375 MG/24HR Place 1 patch onto the skin 2 (two) times a week. 24 patch 3   ibuprofen  (ADVIL ) 200 MG tablet 2 tablets with food or milk Orally as directed     methocarbamol  (ROBAXIN -750) 750 MG tablet Take 1 tablet (750 mg total) by mouth every 8 (eight) hours as needed for up  to 15 doses for muscle spasms. 15 tablet 0   OVER THE COUNTER MEDICATION Calcium-Magnesium-Zinc     progesterone  (PROMETRIUM ) 100 MG capsule Take 1 capsule (100 mg total) by mouth daily. Take at bedtime. 90 capsule 3   Pyridoxine HCl (VITAMIN B6 PO)  (Patient not taking: Reported on 10/23/2023)     No current facility-administered medications for this visit.     ALLERGIES: Patient has no known allergies.  Family History  Problem Relation Age of Onset   Osteopenia Mother    Asthma Father    Graves' disease Sister     Social History   Socioeconomic History   Marital status: Married    Spouse name: Not on file   Number of children: 1   Years of education: 18   Highest education level: Not on file  Occupational History    Employer: [employer]  Tobacco Use   Smoking status: Never   Smokeless tobacco: Never  Vaping Use   Vaping status: Never Used   Substance and Sexual Activity   Alcohol use: Yes    Comment: 2-4 drinks a week   Drug use: No   Sexual activity: Yes    Partners: Male    Birth control/protection: Post-menopausal    Comment: 1st intercourse- 17, partners- 5  Other Topics Concern   Not on file  Social History Narrative   Lives at home w/ her spouse and child   Right-handed   Drinks a cup of coffee each morning   Social Drivers of Corporate investment banker Strain: Not on file  Food Insecurity: Not on file  Transportation Needs: Not on file  Physical Activity: Not on file  Stress: Not on file  Social Connections: Not on file  Intimate Partner Violence: Not on file    Review of Systems  All other systems reviewed and are negative.   PHYSICAL EXAMINATION:   BP 126/84 (BP Location: Left Arm, Patient Position: Sitting)   Pulse 83   Ht 5' 4.25 (1.632 m)   Wt 135 lb (61.2 kg)   LMP 07/29/2019 Comment: sexually active  SpO2 99%   BMI 22.99 kg/m     General appearance: alert, cooperative and appears stated age   ASSESSMENT:  Osteoporosis.  Hx multiple fractures.   PLAN:  Osteoporosis discussed.  Risk factors, treatment options, and follow up with bone densities reviewed.  We discussed that osteoporosis is a silent disease until fracture occurs.  Increase vit D to 3000 international units.   Calcium 500 mg twice daily.  Weight bearing exercise.  Fall risk reduction reviewed.  Reclast, Prolia, and Evenity tx options reviewed along with risks and benefits.  Will do precert for Evenity.   Follow up BMD in 2 years.   35 min  total time was spent for this patient encounter, including preparation, face-to-face counseling with the patient, coordination of care, and documentation of the encounter.

## 2023-10-23 ENCOUNTER — Encounter: Payer: Self-pay | Admitting: Obstetrics and Gynecology

## 2023-10-23 ENCOUNTER — Ambulatory Visit (INDEPENDENT_AMBULATORY_CARE_PROVIDER_SITE_OTHER): Admitting: Obstetrics and Gynecology

## 2023-10-23 VITALS — BP 126/84 | HR 83 | Ht 64.25 in | Wt 135.0 lb

## 2023-10-23 DIAGNOSIS — M810 Age-related osteoporosis without current pathological fracture: Secondary | ICD-10-CM

## 2023-10-23 DIAGNOSIS — Z8781 Personal history of (healed) traumatic fracture: Secondary | ICD-10-CM

## 2023-10-23 NOTE — Patient Instructions (Addendum)
 Romosozumab Injection What is this medication? ROMOSOZUMAB (roe moe SOZ ue mab) prevents and treats osteoporosis. It works by Interior and spatial designer stronger and less likely to break (fracture). It is a monoclonal antibody. This medicine may be used for other purposes; ask your health care provider or pharmacist if you have questions. COMMON BRAND NAME(S): EVENITY What should I tell my care team before I take this medication? They need to know if you have any of these conditions: Dental disease Heart attack Heart disease Kidney problems Low levels of calcium in the blood On dialysis Stroke Wear dentures An unusual or allergic reaction to romosozumab, other medications, foods, dyes or preservatives Pregnant or trying to get pregnant Breast-feeding How should I use this medication? This medication is injected under the skin. It is given by your care team in a hospital or clinic setting. A special MedGuide will be given to you by the pharmacist with each prescription and refill. Be sure to read this information carefully each time. Talk to your care team about the use of this medication in children. Special care may be needed. Overdosage: If you think you have taken too much of this medicine contact a poison control center or emergency room at once. NOTE: This medicine is only for you. Do not share this medicine with others. What if I miss a dose? Keep appointments for follow-up doses. It is important not to miss your dose. Call your care team if you are unable to keep an appointment. What may interact with this medication? Interactions are not expected. This list may not describe all possible interactions. Give your health care provider a list of all the medicines, herbs, non-prescription drugs, or dietary supplements you use. Also tell them if you smoke, drink alcohol, or use illegal drugs. Some items may interact with your medicine. What should I watch for while using this medication? Your  condition will be monitored carefully while you are receiving this medication. You may need bloodwork while taking this medication. You should make sure you get enough calcium and vitamin D  while you are taking this medication. Discuss the foods you eat and the vitamins you take with your care team. Some people who take this medication have severe bone, joint, or muscle pain. This medication may also increase your risk for jaw problems or a broken thigh bone. Tell your care team right away if you have severe pain in your jaw, bones, joints, or muscles. Tell you care team if you have any pain that does not go away or that gets worse. Tell your dentist and dental surgeon that you are taking this medication. You should not have major dental surgery while on this medication. See your dentist to have a dental exam and fix any dental problems before starting this medication. Take good care of your teeth while on this medication. Make sure you see your dentist for regular follow-up appointments. What side effects may I notice from receiving this medication? Side effects that you should report to your care team as soon as possible: Allergic reactions or angioedema--skin rash, itching or hives, swelling of the face, eyes, lips, tongue, arms, or legs, trouble swallowing or breathing Heart attack--pain or tightness in the chest, shoulders, arms, or jaw, nausea, shortness of breath, cold or clammy skin, feeling faint or lightheaded Low calcium level--muscle pain or cramps, confusion, tingling, or numbness in the hands or feet Osteonecrosis of the jaw--pain, swelling, or redness in the mouth, numbness of the jaw, poor healing after dental work,  unusual discharge from the mouth, visible bones in the mouth Severe bone, joint, or muscle pain Stroke--sudden numbness or weakness of the face, arm, or leg, trouble speaking, confusion, trouble walking, loss of balance or coordination, dizziness, severe headache, change in  vision Side effects that usually do not require medical attention (report to your care team if they continue or are bothersome): Headache Joint pain Muscle spasms Pain, redness, or irritation at injection site Swelling of the ankles, hands, or feet This list may not describe all possible side effects. Call your doctor for medical advice about side effects. You may report side effects to FDA at 1-800-FDA-1088. Where should I keep my medication? This medication is given in a hospital or clinic. It will not be stored at home. NOTE: This sheet is a summary. It may not cover all possible information. If you have questions about this medicine, talk to your doctor, pharmacist, or health care provider.  2024 Elsevier/Gold Standard (2021-03-01 00:00:00)  Denosumab Injection (Osteoporosis) What is this medication? DENOSUMAB (den oh SUE mab) prevents and treats osteoporosis. It works by Interior and spatial designer stronger and less likely to break (fracture). It is a monoclonal antibody. This medicine may be used for other purposes; ask your health care provider or pharmacist if you have questions. COMMON BRAND NAME(S): Prolia What should I tell my care team before I take this medication? They need to know if you have any of these conditions: Dental or gum disease Had thyroid  or parathyroid  (glands located in neck) surgery Having dental surgery or a tooth pulled Kidney disease Low levels of calcium in the blood On dialysis Poor nutrition Thyroid  disease Trouble absorbing nutrients from your food An unusual or allergic reaction to denosumab, other medications, foods, dyes, or preservatives Pregnant or trying to get pregnant Breastfeeding How should I use this medication? This medication is injected under the skin. It is given by your care team in a hospital or clinic setting. A special MedGuide will be given to you before each treatment. Be sure to read this information carefully each time. Talk to  your care team about the use of this medication in children. Special care may be needed. Overdosage: If you think you have taken too much of this medicine contact a poison control center or emergency room at once. NOTE: This medicine is only for you. Do not share this medicine with others. What if I miss a dose? Keep appointments for follow-up doses. It is important not to miss your dose. Call your care team if you are unable to keep an appointment. What may interact with this medication? Do not take this medication with any of the following: Other medications that contain denosumab This medication may also interact with the following: Medications that lower your chance of fighting infection Steroid medications, such as prednisone or cortisone This list may not describe all possible interactions. Give your health care provider a list of all the medicines, herbs, non-prescription drugs, or dietary supplements you use. Also tell them if you smoke, drink alcohol, or use illegal drugs. Some items may interact with your medicine. What should I watch for while using this medication? Your condition will be monitored carefully while you are receiving this medication. You may need blood work done while taking this medication. This medication may increase your risk of getting an infection. Call your care team for advice if you get a fever, chills, sore throat, or other symptoms of a cold or flu. Do not treat yourself. Try to avoid  being around people who are sick. Tell your dentist and dental surgeon that you are taking this medication. You should not have major dental surgery while on this medication. See your dentist to have a dental exam and fix any dental problems before starting this medication. Take good care of your teeth while on this medication. Make sure you see your dentist for regular follow-up appointments. This medication may cause low levels of calcium in your body. The risk of severe side  effects is increased in people with kidney disease. Your care team may prescribe calcium and vitamin D  to help prevent low calcium levels while you take this medication. It is important to take calcium and vitamin D  as directed by your care team. Talk to your care team if you may be pregnant. Serious birth defects may occur if you take this medication during pregnancy and for 5 months after the last dose. You will need a negative pregnancy test before starting this medication. Contraception is recommended while taking this medication and for 5 months after the last dose. Your care team can help you find the option that works for you. Talk to your care team before breastfeeding. Changes to your treatment plan may be needed. What side effects may I notice from receiving this medication? Side effects that you should report to your care team as soon as possible: Allergic reactions--skin rash, itching, hives, swelling of the face, lips, tongue, or throat Infection--fever, chills, cough, sore throat, wounds that don't heal, pain or trouble when passing urine, general feeling of discomfort or being unwell Low calcium level--muscle pain or cramps, confusion, tingling, or numbness in the hands or feet Osteonecrosis of the jaw--pain, swelling, or redness in the mouth, numbness of the jaw, poor healing after dental work, unusual discharge from the mouth, visible bones in the mouth Severe bone, joint, or muscle pain Skin infection--skin redness, swelling, warmth, or pain Side effects that usually do not require medical attention (report these to your care team if they continue or are bothersome): Back pain Headache Joint pain Muscle pain Pain in the hands, arms, legs, or feet Runny or stuffy nose Sore throat This list may not describe all possible side effects. Call your doctor for medical advice about side effects. You may report side effects to FDA at 1-800-FDA-1088. Where should I keep my  medication? This medication is given in a hospital or clinic. It will not be stored at home. NOTE: This sheet is a summary. It may not cover all possible information. If you have questions about this medicine, talk to your doctor, pharmacist, or health care provider.  2024 Elsevier/Gold Standard (2022-03-06 00:00:00)  Zoledronic Acid Injection (Bone Disorders) What is this medication? ZOLEDRONIC ACID (ZOE le dron ik AS id) prevents and treats osteoporosis. It may also be used to treat Paget's disease of the bone. It works by Interior and spatial designer stronger and less likely to break (fracture). It belongs to a group of medications called bisphosphonates. This medicine may be used for other purposes; ask your health care provider or pharmacist if you have questions. COMMON BRAND NAME(S): Reclast What should I tell my care team before I take this medication? They need to know if you have any of these conditions: Bleeding disorder Cancer Dental disease Kidney disease Low levels of calcium in the blood Low red blood cell counts Lung or breathing disease, such as asthma Receiving steroids, such as dexamethasone  or prednisone An unusual or allergic reaction to zoledronic acid, other medications, foods, dyes,  or preservatives Pregnant or trying to get pregnant Breast-feeding How should I use this medication? This medication is injected into a vein. It is given by your care team in a hospital or clinic setting. A special MedGuide will be given to you before each treatment. Be sure to read this information carefully each time. Talk to your care team about the use of this medication in children. Special care may be needed. Overdosage: If you think you have taken too much of this medicine contact a poison control center or emergency room at once. NOTE: This medicine is only for you. Do not share this medicine with others. What if I miss a dose? Keep appointments for follow-up doses. It is important not  to miss your dose. Call your care team if you are unable to keep an appointment. What may interact with this medication? Certain antibiotics given by injection Medications for pain and inflammation, such as ibuprofen , naproxen, NSAIDs Some diuretics, such as bumetanide, furosemide Teriparatide This list may not describe all possible interactions. Give your health care provider a list of all the medicines, herbs, non-prescription drugs, or dietary supplements you use. Also tell them if you smoke, drink alcohol, or use illegal drugs. Some items may interact with your medicine. What should I watch for while using this medication? Visit your care team for regular checks on your progress. It may be some time before you see the benefit from this medication. Some people who take this medication have severe bone, joint, or muscle pain. This medication may also increase your risk for jaw problems or a broken thigh bone. Tell your care team right away if you have severe pain in your jaw, bones, joints, or muscles. Tell your care team if you have any pain that does not go away or that gets worse. You should make sure you get enough calcium and vitamin D  while you are taking this medication. Discuss the foods you eat and the vitamins you take with your care team. You may need bloodwork while taking this medication. Tell your dentist and dental surgeon that you are taking this medication. You should not have major dental surgery while on this medication. See your dentist to have a dental exam and fix any dental problems before starting this medication. Take good care of your teeth while on this medication. Make sure you see your dentist for regular follow-up appointments. What side effects may I notice from receiving this medication? Side effects that you should report to your care team as soon as possible: Allergic reactions--skin rash, itching, hives, swelling of the face, lips, tongue, or throat Kidney  injury--decrease in the amount of urine, swelling of the ankles, hands, or feet Low calcium level--muscle pain or cramps, confusion, tingling, or numbness in the hands or feet Osteonecrosis of the jaw--pain, swelling, or redness in the mouth, numbness of the jaw, poor healing after dental work, unusual discharge from the mouth, visible bones in the mouth Severe bone, joint, or muscle pain Side effects that usually do not require medical attention (report to your care team if they continue or are bothersome): Diarrhea Dizziness Headache Nausea Stomach pain Vomiting This list may not describe all possible side effects. Call your doctor for medical advice about side effects. You may report side effects to FDA at 1-800-FDA-1088. Where should I keep my medication? This medication is given in a hospital or clinic. It will not be stored at home. NOTE: This sheet is a summary. It may not cover all possible  information. If you have questions about this medicine, talk to your doctor, pharmacist, or health care provider.  2024 Elsevier/Gold Standard (2021-03-17 00:00:00)  Weak, Fragile Bones (Osteoporosis): What to Know  Osteoporosis is when the bones become thin and less dense than normal. Osteoporosis makes bones more fragile and more likely to break. Over time, the condition can cause your bones to become so weak that they break, or fracture, after a minor fall. Bones in the hip, wrist, and spine are most likely to break. What are the causes? The exact cause of this condition is not known. What increases the risk? Having family members with this condition. Taking: Steroid medicines. Anti-seizure medicines. Being female. Being 79 years old or older. Being of [place] decent. Smoking or using other products that contain nicotine or tobacco. Not exercising. What are the signs or symptoms? A broken bone might be the first sign, especially if the break results from a fall or injury that  usually would not cause a bone to break. Other signs and symptoms include: Pain in the neck or low back. Being hunched over. Getting shorter. How is this diagnosed? This condition may be diagnosed based on: Your medical history. A physical exam. A bone mineral density test, also called a DXA or DEXA test. This test uses X-rays to measure how dense your bones are. How is this treated? This condition may be treated with medicines and supplements, including: Taking medicines to slow bone loss or help make the bones stronger. Taking calcium and vitamin D  supplements every day. Taking hormone replacement medicines, such as estrogen for female and testosterone for males. It may also be treated by: Quitting smoking or using tobacco products. Doing exercises. Limiting how much alcohol you drink. Eating more foods with calcium and vitamin D  in them. Monitoring your blood levels of calcium and vitamin D . The goal of treatment is to strengthen your bones and lower your risk for a bone break. Follow these instructions at home: Eating and drinking Eat plenty of food with calcium and vitamin D  in them. This may include: Some fish, such as salmon and tuna. Foods that have calcium and vitamin D  added to them, such as some cereals. Egg yolks. Cheese. Liver.  Activity Exercise as told. Ask what things are safe for you to do. You may be told to: Do exercises that make your muscles work to hold your body weight up, such as tai chi, yoga, or walking. These are called weight-bearing exercises. Do exercises to make your muscles stronger, such as lifting weights. These are called muscle-strengthening exercises. Lifestyle Do not drink alcohol if your health care provider tells you not to drink. Your health care provider tells you not to drink. You are pregnant, may be pregnant, or are planning to become pregnant. If you drink alcohol: Limit how much you have to: 0-1 drink a day if you're female. 0-2  drinks a day if you're female. Know how much alcohol is in your drink. In the [place] one drink is one 12 oz bottle of beer (355 mL), one 5 oz glass of wine (148 mL), or one 1 oz glass of hard liquor (44 mL). Do not smoke, vape, or use nicotine or tobacco. Preventing falls Use a cane, walker, scooter, or crutches to help you move around if needed. Keep rooms well-lit and get rid of clutter. Put away things on the floor that could make you trip, such as cords and rugs. Put grab bars in bathrooms and safety rails on stairs.  Use rubber mats in slippery areas, like bathrooms. Wear shoes that: Fit you well. Support your feet. Have closed toes. Have rubber soles or low heels. Talk to your provider about all of the medicines you take. Some medicines can make you more likely to fall because they can cause dizziness or changes in blood pressure. General instructions Take your medicines only as told. Keep all follow-up visits. Your provider may want to repeat tests. Where to find more information Bone Health and Osteoporosis Foundation: bonehealthandosteoporosis.org Contact a health care provider if: You have never been screened for osteoporosis and you are: A female who is 56 years old or older. A female who is age 31 years old or older. Get help right away if: You fall. You get hurt. This information is not intended to replace advice given to you by your health care provider. Make sure you discuss any questions you have with your health care provider. Document Revised: 12/11/2022 Document Reviewed: 08/17/2022 Elsevier Patient Education  2024 [employer].

## 2023-10-24 ENCOUNTER — Telehealth: Payer: Self-pay | Admitting: Obstetrics and Gynecology

## 2023-10-24 DIAGNOSIS — M25532 Pain in left wrist: Secondary | ICD-10-CM | POA: Diagnosis not present

## 2023-10-24 NOTE — Telephone Encounter (Signed)
 Please precert Evenity for my patient.   She has osteoporosis, and history of multiple fractures.

## 2023-10-25 ENCOUNTER — Other Ambulatory Visit: Payer: Self-pay | Admitting: *Deleted

## 2023-10-25 NOTE — Telephone Encounter (Signed)
 Patient left message on triage line requesting RX for Covid Moderna Vaccine to [facility], discussed during 10/23/23 OV.    Rx pended.   Routing to Dr. Nikki

## 2023-10-26 ENCOUNTER — Other Ambulatory Visit: Payer: Self-pay | Admitting: Obstetrics and Gynecology

## 2023-10-26 MED ORDER — COVID-19 MRNA VACC (MODERNA) 50 MCG/0.5ML IM SUSY: 0.5000 mL | PREFILLED_SYRINGE | Freq: Once | INTRAMUSCULAR | 0 refills | Status: AC

## 2023-10-26 MED ORDER — COVID-19 MRNA VACC (MODERNA) 50 MCG/0.5ML IM SUSY: 0.5000 mL | PREFILLED_SYRINGE | Freq: Once | INTRAMUSCULAR | 0 refills | Status: DC

## 2023-10-28 NOTE — Telephone Encounter (Signed)
 Patient notified. Encounter closed

## 2023-11-07 DIAGNOSIS — M25532 Pain in left wrist: Secondary | ICD-10-CM | POA: Diagnosis not present

## 2023-11-18 NOTE — Telephone Encounter (Signed)
 Call to patient. Patient states wrist fracture and surgery in April of 2025. Takes calcium and vitamin D . States she will only have [employer] through the end of this year and then will change in January. RN advised will need to submit new insurance once received so benefits can be rerun. Patient agreeable.

## 2023-11-18 NOTE — Progress Notes (Signed)
 [facility] - Clinic Note  12/02/2023     CHIEF COMPLAINT Patient presents for Retina Follow Up   HISTORY OF PRESENT ILLNESS: Tiffany Webb is a 53 y.o. female who presents to the clinic today for:   HPI     Retina Follow Up   Diagnosis: ERM/PVD.  In left eye.  This started 3 years ago.  Severity is moderate.  Duration of 1 year.  I, the attending physician,  performed the HPI with the patient and updated documentation appropriately.        Comments   Pt states VA is stable, no changes. Pt does not use any drops currently.       Last edited by Valdemar Rogue, MD on 12/08/2023  8:23 PM.     Patient states the vision is the same. She feels the vision is double at times.   Clem Brands, PA-C  [facility] [address] [place],  [place] [address]  HISTORICAL INFORMATION:   Selected notes from the MEDICAL RECORD NUMBER Referred by Clem Brands, PA-C for ERM LEE:  Ocular Hx- PMH-    CURRENT MEDICATIONS: No current outpatient medications on file. (Ophthalmic Drugs)   No current facility-administered medications for this visit. (Ophthalmic Drugs)   Current Outpatient Medications (Other)  Medication Sig   Ascorbic Acid (VITAMIN C PO)    BIOTIN PO    Cholecalciferol 125 MCG (5000 UT) capsule 1 tablet Orally Once a day   estradiol  (VIVELLE -DOT) 0.0375 MG/24HR Place 1 patch onto the skin 2 (two) times a week.   ibuprofen  (ADVIL ) 200 MG tablet 2 tablets with food or milk Orally as directed   methocarbamol  (ROBAXIN -750) 750 MG tablet Take 1 tablet (750 mg total) by mouth every 8 (eight) hours as needed for up to 15 doses for muscle spasms.   OVER THE COUNTER MEDICATION Calcium-Magnesium-Zinc   progesterone  (PROMETRIUM ) 100 MG capsule Take 1 capsule (100 mg total) by mouth daily. Take at bedtime.   Pyridoxine HCl (VITAMIN B6 PO)  (Patient not taking: Reported on 10/23/2023)   Current Facility-Administered Medications  (Other)  Medication Route   Romosozumab-aqqg (EVENITY) 105 MG/1. injection 210 mg Subcutaneous   REVIEW OF SYSTEMS: ROS   Positive for: Eyes Negative for: Constitutional, Gastrointestinal, Neurological, Skin, Genitourinary, Musculoskeletal, HENT, Endocrine, Cardiovascular, Respiratory, Psychiatric, Allergic/Imm, Heme/Lymph Last edited by Elnor Avelina RAMAN, COT on 12/02/2023  8:29 AM.     ALLERGIES No Known Allergies  PAST MEDICAL HISTORY Past Medical History:  Diagnosis Date   Abnormal Pap smear of cervix    AMA (advanced maternal age) multigravida 35+    Gonadotropin resistant ovary syndrome    Hx of Bell's palsy    several months ago   Hypothyroidism    Menstrual migraine 08/11/2015   Migraines    No pertinent past medical history    PONV (postoperative nausea and vomiting)    Past Surgical History:  Procedure Laterality Date   DILATION AND EVACUATION  01/08/2011   Procedure: DILATATION AND EVACUATION (D&E) 2ND TRIMESTER;  Surgeon: Marie-Lyne Lavoie;  Location: [facility];  Service: Gynecology;  Laterality: N/A;   GYNECOLOGIC CRYOSURGERY     WISDOM TOOTH EXTRACTION     WRIST SURGERY  06/12/2023   FAMILY HISTORY Family History  Problem Relation Age of Onset   Osteopenia Mother    Asthma Father    Graves' disease Sister    SOCIAL HISTORY Social History   Tobacco Use   Smoking status:  Never   Smokeless tobacco: Never  Vaping Use   Vaping status: Never Used  Substance Use Topics   Alcohol use: Yes    Comment: 2-4 drinks a week   Drug use: No       OPHTHALMIC EXAM:  Base Eye Exam     Visual Acuity (Snellen - Linear)       Right Left   Dist cc 20/20 -1 20/20 -1    Correction: Contacts         Tonometry (Tonopen, 8:38 AM)       Right Left   Pressure 17 19         Pupils       Pupils Dark Light Shape React APD   Right PERRL 3 2 Round Brisk None   Left PERRL 3 2 Round Brisk None         Visual Fields       Left Right    Full Full          Extraocular Movement       Right Left    Full, Ortho Full, Ortho         Neuro/Psych     Oriented x3: Yes   Mood/Affect: Normal         Dilation     Both eyes: 1.0% Mydriacyl, 2.5% Phenylephrine  @ 8:39 AM           Slit Lamp and Fundus Exam     Slit Lamp Exam       Right Left   Lids/Lashes Dermatochalasis - upper lid Normal   Conjunctiva/Sclera White and quiet White and quiet   Cornea Clear Debris in tear film   Anterior Chamber deep and clear deep and clear   Iris Round and dilated Round and dilated   Lens 1-2+ Nuclear sclerosis 1-2+ Nuclear sclerosis   Anterior Vitreous Mild syneresis Mild syneresis, Posterior vitreous detachment, vitreous condensations         Fundus Exam       Right Left   Disc Pink and Sharp, Compact Pink and Sharp   C/D Ratio 0.2 0.2   Macula Flat, Good foveal reflex, mild RPE mottling, trace ERM, No heme or edema Blunted foveal reflex, ERM with striae and central thickening -- stable, No heme   Vessels Vascular attenuation, Tortuous mild attenuation, mild tortuosity   Periphery Attached, peripheral cystoid degeneration, pigmented pavingstone degeneration, No heme Attached, pigmented cystoid degeneration, pigmented pavingstone, No heme           Refraction     Wearing Rx       Sphere Cylinder Axis   Right -11.50 +1.00 047   Left -11.00 Sphere            IMAGING AND PROCEDURES  Imaging and Procedures for 12/02/2023  OCT, Retina - OU - Both Eyes       Right Eye Quality was good. Central Foveal Thickness: 299. Progression has worsened. Findings include normal foveal contour, no IRF, no SRF, myopic contour, epiretinal membrane, macular pucker, vitreomacular adhesion (Interval development of mild ERM with pucker, Partial PVD).   Left Eye Quality was good. Central Foveal Thickness: 480. Progression has been stable. Findings include no IRF, no SRF, abnormal foveal contour, myopic contour, epiretinal membrane,  macular pucker, preretinal fibrosis (Persistent ERM w/ PRF, no IRF/SRF -and central thickening- stable from prior).   Notes *Images captured and stored on drive  Diagnosis / Impression:  OD: Interval development of mild ERM with pucker, Partial  PVD OS: Persistent ERM w/ PRF, no IRF/SRF -and central thickening- stable from prior  Clinical management:  See below  Abbreviations: NFP - Normal foveal profile. CME - cystoid macular edema. PED - pigment epithelial detachment. IRF - intraretinal fluid. SRF - subretinal fluid. EZ - ellipsoid zone. ERM - epiretinal membrane. ORA - outer retinal atrophy. ORT - outer retinal tubulation. SRHM - subretinal hyper-reflective material. IRHM - intraretinal hyper-reflective material            ASSESSMENT/PLAN:    ICD-10-CM   1. Epiretinal membrane (ERM) of both eyes  H35.373 OCT, Retina - OU - Both Eyes    2. Posterior vitreous detachment of left eye  H43.812     3. Nuclear sclerosis of both eyes  H25.13      1. Epiretinal membrane, OU  - OCT shows OD: Interval development of mild ERM with pucker, Partial PVD, OS: Persistent ERM w/ PRF, no IRF/SRF -and central thickening- stable from prior - BCVA 20/20 OU  - pt asymptomatic, no frank metamorphopsia - no indication for surgery at this time - monitor - f/u 1 yr -- DFE/OCT  2. PVD / vitreous syneresis OS  - Discussed findings and prognosis  - No RT or RD on 360 peripheral exam  - Reviewed s/s of RT/RD  - Strict return precautions for any such RT/RD signs/symptoms  3. Nuclear sclerosis OU - The symptoms of cataract, surgical options, and treatments and risks were discussed with patient. - discussed diagnosis and progression - not yet visually significant - monitor    Ophthalmic Meds Ordered this visit:  No orders of the defined types were placed in this encounter.    Return in about 1 year (around 12/01/2024) for f/u ERM OU, DFE, OCT.  There are no Patient Instructions on file for  this visit.   Explained the diagnoses, plan, and follow up with the patient and they expressed understanding.  Patient expressed understanding of the importance of proper follow up care.   This document serves as a record of services personally performed by Redell JUDITHANN Hans, MD, PhD. It was created on their behalf by Avelina Pereyra, COA an ophthalmic technician. The creation of this record is the provider's dictation and/or activities during the visit.   Electronically signed by: Avelina GORMAN Pereyra, COT  12/08/23  8:23 PM   This document serves as a record of services personally performed by Redell JUDITHANN Hans, MD, PhD. It was created on their behalf by Wanda GEANNIE Keens, COT an ophthalmic technician. The creation of this record is the provider's dictation and/or activities during the visit.    Electronically signed by:  Wanda GEANNIE Keens, COT  12/08/23 8:23 PM  Redell JUDITHANN Hans, M.D., Ph.D. Diseases & Surgery of the Retina and Vitreous [facility]  I have reviewed the above documentation for accuracy and completeness, and I agree with the above. Redell JUDITHANN Hans, M.D., Ph.D. 12/08/23 8:23 PM   Abbreviations: M myopia (nearsighted); A astigmatism; H hyperopia (farsighted); P presbyopia; Mrx spectacle prescription;  CTL contact lenses; OD right eye; OS left eye; OU both eyes  XT exotropia; ET esotropia; PEK punctate epithelial keratitis; PEE punctate epithelial erosions; DES dry eye syndrome; MGD meibomian gland dysfunction; ATs artificial tears; PFAT's preservative free artificial tears; NSC nuclear sclerotic cataract; PSC posterior subcapsular cataract; ERM epi-retinal membrane; PVD posterior vitreous detachment; RD retinal detachment; DM diabetes mellitus; DR diabetic retinopathy; NPDR non-proliferative diabetic retinopathy; PDR proliferative diabetic retinopathy; CSME clinically significant macular  edema; DME diabetic macular edema; dbh dot blot hemorrhages; CWS cotton wool  spot; POAG primary open angle glaucoma; C/D cup-to-disc ratio; HVF humphrey visual field; GVF goldmann visual field; OCT optical coherence tomography; IOP intraocular pressure; BRVO Branch retinal vein occlusion; CRVO central retinal vein occlusion; CRAO central retinal artery occlusion; BRAO branch retinal artery occlusion; RT retinal tear; SB scleral buckle; PPV pars plana vitrectomy; VH Vitreous hemorrhage; PRP panretinal laser photocoagulation; IVK intravitreal kenalog; VMT vitreomacular traction; MH Macular hole;  NVD neovascularization of the disc; NVE neovascularization elsewhere; AREDS age related eye disease study; ARMD age related macular degeneration; POAG primary open angle glaucoma; EBMD epithelial/anterior basement membrane dystrophy; ACIOL anterior chamber intraocular lens; IOL intraocular lens; PCIOL posterior chamber intraocular lens; Phaco/IOL phacoemulsification with intraocular lens placement; PRK photorefractive keratectomy; LASIK laser assisted in situ keratomileusis; HTN hypertension; DM diabetes mellitus; COPD chronic obstructive pulmonary disease

## 2023-11-20 ENCOUNTER — Other Ambulatory Visit: Payer: Self-pay | Admitting: *Deleted

## 2023-11-20 DIAGNOSIS — M810 Age-related osteoporosis without current pathological fracture: Secondary | ICD-10-CM

## 2023-11-20 MED ORDER — ROMOSOZUMAB-AQQG 105 MG/1.17ML ~~LOC~~ SOSY: 210.0000 mg | PREFILLED_SYRINGE | Freq: Once | SUBCUTANEOUS | Status: AC

## 2023-11-20 NOTE — Telephone Encounter (Signed)
 Insurance information submitted to [employer] portal. Will await summary of benefits for Evenity. See Evenity referral.   Encounter closed.

## 2023-12-02 ENCOUNTER — Encounter (INDEPENDENT_AMBULATORY_CARE_PROVIDER_SITE_OTHER): Payer: Self-pay | Admitting: Ophthalmology

## 2023-12-02 ENCOUNTER — Ambulatory Visit (INDEPENDENT_AMBULATORY_CARE_PROVIDER_SITE_OTHER): Payer: 59 | Admitting: Ophthalmology

## 2023-12-02 DIAGNOSIS — H43812 Vitreous degeneration, left eye: Secondary | ICD-10-CM | POA: Diagnosis not present

## 2023-12-02 DIAGNOSIS — H35372 Puckering of macula, left eye: Secondary | ICD-10-CM

## 2023-12-02 DIAGNOSIS — H35373 Puckering of macula, bilateral: Secondary | ICD-10-CM

## 2023-12-02 DIAGNOSIS — H2513 Age-related nuclear cataract, bilateral: Secondary | ICD-10-CM | POA: Diagnosis not present

## 2023-12-06 ENCOUNTER — Ambulatory Visit: Admitting: Family Medicine

## 2023-12-08 ENCOUNTER — Encounter (INDEPENDENT_AMBULATORY_CARE_PROVIDER_SITE_OTHER): Payer: Self-pay | Admitting: Ophthalmology

## 2023-12-17 DIAGNOSIS — M810 Age-related osteoporosis without current pathological fracture: Secondary | ICD-10-CM | POA: Diagnosis not present

## 2023-12-17 DIAGNOSIS — Z7689 Persons encountering health services in other specified circumstances: Secondary | ICD-10-CM | POA: Diagnosis not present

## 2023-12-18 ENCOUNTER — Telehealth: Payer: Self-pay | Admitting: *Deleted

## 2023-12-18 ENCOUNTER — Other Ambulatory Visit: Payer: Self-pay | Admitting: *Deleted

## 2023-12-18 ENCOUNTER — Encounter: Payer: Self-pay | Admitting: *Deleted

## 2023-12-18 DIAGNOSIS — M810 Age-related osteoporosis without current pathological fracture: Secondary | ICD-10-CM

## 2023-12-18 MED ORDER — DENOSUMAB 60 MG/ML ~~LOC~~ SOSY: 60.0000 mg | PREFILLED_SYRINGE | Freq: Once | SUBCUTANEOUS | Status: AC

## 2023-12-18 NOTE — Telephone Encounter (Signed)
 Insurance requested to switch from Evenity to Prolia. Request sent to Pharmacotherapy clinic.

## 2023-12-18 NOTE — Telephone Encounter (Signed)
 Called patient. Patient states she does not wish to settle for Prolia. Patient states her insurance will change at the beginning of the new year. Patient asking if Dr. Nikki is okay if she waits to start Evenity at the beginning of the year when she has new coverage (unsure at this time what new insurance will be)? RN advised would review with Dr. Nikki and return call with recommendations. Patient agreeable.   Routing to provider for review.

## 2023-12-18 NOTE — Telephone Encounter (Signed)
 Ok to wait and check new insurance benefits in January for Tiffany Webb.   Please have her forward her new insurance information when she has it.

## 2023-12-18 NOTE — Telephone Encounter (Signed)
 See telephone encounter.

## 2023-12-20 DIAGNOSIS — M549 Dorsalgia, unspecified: Secondary | ICD-10-CM | POA: Diagnosis not present

## 2023-12-25 ENCOUNTER — Encounter: Payer: Self-pay | Admitting: Family Medicine

## 2023-12-25 ENCOUNTER — Ambulatory Visit (INDEPENDENT_AMBULATORY_CARE_PROVIDER_SITE_OTHER): Admitting: Family Medicine

## 2023-12-25 ENCOUNTER — Ambulatory Visit: Admitting: Family Medicine

## 2023-12-25 VITALS — BP 122/80 | HR 75 | Temp 98.2°F | Ht 64.0 in | Wt 136.0 lb

## 2023-12-25 DIAGNOSIS — M810 Age-related osteoporosis without current pathological fracture: Secondary | ICD-10-CM | POA: Diagnosis not present

## 2023-12-25 DIAGNOSIS — Z23 Encounter for immunization: Secondary | ICD-10-CM | POA: Diagnosis not present

## 2023-12-25 DIAGNOSIS — Z7689 Persons encountering health services in other specified circumstances: Secondary | ICD-10-CM | POA: Diagnosis not present

## 2023-12-25 NOTE — Patient Instructions (Addendum)
-  It was nice to meet you and look forward to taking care of you. -Pneumonia vaccine provided. -Discussed about osteoporosis and treatment options with insurance coverage. Placed a referral to osteoporosis management clinic. Please call the office or send a MyChart message if you do not receive a phone call or a MyChart message about appointment in 2 weeks.  -Follow up in 1 year for a physical.

## 2023-12-25 NOTE — Progress Notes (Signed)
 New Patient Office Visit  Subjective   Patient ID: Tiffany Webb, female    DOB: Jul 17, 1970  Age: 53 y.o. MRN: [id number]  CC:  Chief Complaint  Patient presents with   Establish Care    HPI Tiffany Webb presents to establish care with new provider.  Patients previous primary care provider: [facility] with Dr. Lamarr Rotunda. Last seen: several years   Specialist: [facility] [place] with Dr. Silva Amundson  [facility]-usually every other year  Tiffany Webb an appointment on Friday about another colonoscopy   Patient had a bone density scan completed on 09/03/2023 through Dr. Cathlyn with [facility] [place]. Resulted in osteoporosis. She is taking vitamin D  and calcium supplements. She has been prescribed Evenity injections and Prolia injections, but not started either medication. She reports insurance is not covering [place], but would like to appeal this since she has had previous fractures. She has an appointment tomorrow with Dr. Cathlyn to discuss further, but feels their office is delayed in the appeal process.  Outpatient Encounter Medications as of 12/25/2023  Medication Sig   Ascorbic Acid (VITAMIN C PO)    BIOTIN PO    CALCIUM PO Take 1,000 mg by mouth.   Cholecalciferol 125 MCG (5000 UT) capsule 1 tablet Orally Once a day   estradiol  (VIVELLE -DOT) 0.0375 MG/24HR Place 1 patch onto the skin 2 (two) times a week.   ibuprofen  (ADVIL ) 200 MG tablet 2 tablets with food or milk Orally as directed   MAGNESIUM PO Take 500 mg by mouth.   Menatetrenone (VITAMIN K2) 100 MCG TABS Take by mouth.   progesterone  (PROMETRIUM ) 100 MG capsule Take 1 capsule (100 mg total) by mouth daily. Take at bedtime.   Zinc Sulfate (ZINC 15 PO) Take by mouth.   [DISCONTINUED] methocarbamol  (ROBAXIN -750) 750 MG tablet Take 1 tablet (750 mg total) by mouth every 8 (eight) hours as needed for up to 15 doses for muscle  spasms.   [DISCONTINUED] OVER THE COUNTER MEDICATION Calcium-Magnesium-Zinc   [DISCONTINUED] Pyridoxine HCl (VITAMIN B6 PO)  (Patient not taking: Reported on 10/23/2023)   Facility-Administered Encounter Medications as of 12/25/2023  Medication   denosumab (PROLIA) injection 60 mg   Romosozumab-aqqg (EVENITY) 105 MG/1. injection 210 mg    Past Medical History:  Diagnosis Date   Abnormal Pap smear of cervix    AMA (advanced maternal age) multigravida 35+    Gonadotropin resistant ovary syndrome    Hx of Bell's palsy    several months ago   Hypothyroidism    Menstrual migraine 08/11/2015   Migraines    No pertinent past medical history    PONV (postoperative nausea and vomiting)     Past Surgical History:  Procedure Laterality Date   DILATION AND EVACUATION  01/08/2011   Procedure: DILATATION AND EVACUATION (D&E) 2ND TRIMESTER;  Surgeon: Marie-Lyne Lavoie;  Location: [facility];  Service: Gynecology;  Laterality: N/A;   GYNECOLOGIC CRYOSURGERY     WISDOM TOOTH EXTRACTION     WRIST SURGERY  06/12/2023    Family History  Problem Relation Age of Onset   Osteopenia Mother    Asthma Father    Graves' disease Sister     Social History   Socioeconomic History   Marital status: Married    Spouse name: Not on file   Number of children: 1   Years of education: 18   Highest education level: Professional school degree (e.g., MD, DDS, DVM, JD)  Occupational History    Employer: [employer]  Tobacco Use   Smoking status: Never   Smokeless tobacco: Never  Vaping Use   Vaping status: Never Used  Substance and Sexual Activity   Alcohol use: Yes    Comment: 2-4 drinks a week   Drug use: No   Sexual activity: Yes    Partners: Male    Birth control/protection: Post-menopausal    Comment: 1st intercourse- 17, partners- 5  Other Topics Concern   Not on file  Social History Narrative   Lives at home w/ her spouse and child   Right-handed   Drinks a cup of coffee each  morning   Social Drivers of Corporate Investment Banker Strain: Low Risk  (12/25/2023)   Overall Financial Resource Strain (CARDIA)    Difficulty of Paying Living Expenses: Not hard at all  Food Insecurity: No Food Insecurity (12/25/2023)   Hunger Vital Sign    Worried About Running Out of Food in the Last Year: Never true    Ran Out of Food in the Last Year: Never true  Transportation Needs: No Transportation Needs (12/25/2023)   PRAPARE - Administrator, Civil Service (Medical): No    Lack of Transportation (Non-Medical): No  Physical Activity: Inactive (12/25/2023)   Exercise Vital Sign    Days of Exercise per Week: 4 days    Minutes of Exercise per Session: 0 min  Stress: No Stress Concern Present (12/25/2023)   [employer] of Occupational Health - Occupational Stress Questionnaire    Feeling of Stress: Not at all  Social Connections: Socially Integrated (12/25/2023)   Social Connection and Isolation Panel    Frequency of Communication with Friends and Family: More than three times a week    Frequency of Social Gatherings with Friends and Family: Twice a week    Attends Religious Services: More than 4 times per year    Active Member of [employer] or Organizations: No    Attends Engineer, Structural: More than 4 times per year    Marital Status: Married  Catering Manager Violence: Not At Risk (12/25/2023)   Humiliation, Afraid, Rape, and Kick questionnaire    Fear of Current or Ex-Partner: No    Emotionally Abused: No    Physically Abused: No    Sexually Abused: No    ROS See HPI above    Objective  BP 122/80   Pulse 75   Temp 98.2 F (36.8 C) (Oral)   Ht 5' 4 (1.626 m)   Wt 136 lb (61.7 kg)   LMP 07/29/2019 Comment: sexually active  SpO2 98%   BMI 23.34 kg/m   Physical Exam Vitals reviewed.  Constitutional:      General: She is not in acute distress.    Appearance: Normal appearance. She is normal weight. She is not ill-appearing,  toxic-appearing or diaphoretic.  HENT:     Head: Normocephalic and atraumatic.  Eyes:     General:        Right eye: No discharge.        Left eye: No discharge.     Conjunctiva/sclera: Conjunctivae normal.  Cardiovascular:     Rate and Rhythm: Normal rate and regular rhythm.     Heart sounds: Normal heart sounds. No murmur heard.    No friction rub. No gallop.  Pulmonary:     Effort: Pulmonary effort is normal. No respiratory distress.     Breath sounds: Normal breath sounds.  Musculoskeletal:  General: Normal range of motion.     Right lower leg: No edema.     Left lower leg: No edema.  Skin:    General: Skin is warm and dry.  Neurological:     General: No focal deficit present.     Mental Status: She is alert and oriented to person, place, and time. Mental status is at baseline.  Psychiatric:        Mood and Affect: Mood normal.        Behavior: Behavior normal.        Thought Content: Thought content normal.        Judgment: Judgment normal.      Assessment & Plan:  Age-related osteoporosis without current pathological fracture -     Amb Referral to Osteoporosis Management   Immunization due -     Pneumococcal conjugate vaccine 20-valent  Encounter to establish care  1.Review health maintenance:  -Hep B vaccine: Will obtain at a later time -PNA: Administered -RSV: Will obtain at a later time  -Hep C screening: Will collect when drawing labs 2.Discussed about osteoporosis and treatment options with insurance coverage. Placed a referral to osteoporosis management clinic. Advised to please call the office or send a MyChart message if she does not receive a phone call or a MyChart message about appointment in 2 weeks.  Return in about 1 year (around 12/24/2024) for physical.   Marshawn Normoyle, NP

## 2023-12-26 ENCOUNTER — Ambulatory Visit (INDEPENDENT_AMBULATORY_CARE_PROVIDER_SITE_OTHER): Admitting: Obstetrics and Gynecology

## 2023-12-26 ENCOUNTER — Encounter: Payer: Self-pay | Admitting: Obstetrics and Gynecology

## 2023-12-26 ENCOUNTER — Telehealth: Payer: Self-pay

## 2023-12-26 ENCOUNTER — Telehealth: Payer: Self-pay | Admitting: Obstetrics and Gynecology

## 2023-12-26 VITALS — BP 110/74 | HR 78

## 2023-12-26 DIAGNOSIS — M810 Age-related osteoporosis without current pathological fracture: Secondary | ICD-10-CM | POA: Diagnosis not present

## 2023-12-26 DIAGNOSIS — Z8781 Personal history of (healed) traumatic fracture: Secondary | ICD-10-CM | POA: Diagnosis not present

## 2023-12-26 MED ORDER — ROMOSOZUMAB-AQQG 105 MG/1.17ML ~~LOC~~ SOSY: 210.0000 mg | PREFILLED_SYRINGE | Freq: Once | SUBCUTANEOUS | Status: AC

## 2023-12-26 NOTE — Telephone Encounter (Signed)
 SABRA

## 2023-12-26 NOTE — Telephone Encounter (Signed)
 Evenity  VOB initiated via [email or web address]  Last OV:  Next OV:  Last Evenity  inj:  Next Evenity  inj DUE: NEW START

## 2023-12-26 NOTE — Telephone Encounter (Signed)
 Is she proceeding this year or are we waiting until her insurance change in January? Thanks.

## 2023-12-26 NOTE — Progress Notes (Signed)
 GYNECOLOGY  VISIT   HPI: 53 y.o.   Married  Caucasian female   [phone] with Patient's last menstrual period was 07/29/2019.   here for: Discussion about osteoporosis.  Compression deformity of the spine. Wrist and ulnar fracture. Osteoporosis of right hip and spine. Normal PTH, Ph, vit D, CMP.  I recommended Evenity due to her prior fractures, and her insurance approved Prolia.   Her insurance will change in January.     Patient's new PCP placed a referral to osteoporosis management clinic, and she has no current appointment.     On HRT.  Menopausal symptoms are controlled on her estrogen and progesterone .   GYNECOLOGIC HISTORY: Patient's last menstrual period was 07/29/2019. Contraception:  PMP Menopausal hormone therapy:  Vivelle  and progesterone   Last 2 paps:  08/06/23 neg HR HPV neg, 04/20/21 neg   History of abnormal Pap or positive HPV:  no Mammogram:  06/04/23 Breast Density Cat D, BIRADS Cat 1 neg        OB History     Gravida  3   Para  1   Term  1   Preterm  0   AB  2   Living  1      SAB  1   IAB  1   Ectopic  0   Multiple  0   Live Births  1              Patient Active Problem List   Diagnosis Date Noted   Menstrual migraine 08/11/2015   Family history of thyroid  disease 12/15/2013   High serum thyroid  stimulating hormone (TSH) 12/15/2013   History of dilatation and curettage 12/15/2013   History of infertility 12/15/2013   Recurrent pregnancy loss without current pregnancy 12/15/2013   SVD (spontaneous vaginal delivery) 07/15/2012   Postpartum care following vaginal delivery (6/3) 07/15/2012   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 19/24/2013   AMA (advanced maternal age) multigravida 35+ 12/21/2010   Abnormal first trimester screen 12/21/2010    Past Medical History:  Diagnosis Date   Abnormal Pap smear of cervix    AMA (advanced maternal age) multigravida 35+    Gonadotropin resistant ovary syndrome    Hx of  Bell's palsy    several months ago   Hypothyroidism    Menstrual migraine 08/11/2015   Migraines    No pertinent past medical history    Osteoporosis    PONV (postoperative nausea and vomiting)     Past Surgical History:  Procedure Laterality Date   DILATION AND EVACUATION  01/08/2011   Procedure: DILATATION AND EVACUATION (D&E) 2ND TRIMESTER;  Surgeon: Marie-Lyne Lavoie;  Location: [facility];  Service: Gynecology;  Laterality: N/A;   GYNECOLOGIC CRYOSURGERY     WISDOM TOOTH EXTRACTION     WRIST SURGERY  06/12/2023    Current Outpatient Medications  Medication Sig Dispense Refill   Ascorbic Acid (VITAMIN C PO)      BIOTIN PO      CALCIUM PO Take 1,000 mg by mouth.     estradiol  (VIVELLE -DOT) 0.0375 MG/24HR Place 1 patch onto the skin 2 (two) times a week. 24 patch 3   ibuprofen  (ADVIL ) 200 MG tablet 2 tablets with food or milk Orally as directed     MAGNESIUM PO Take 500 mg by mouth.     Menatetrenone (VITAMIN K2) 100 MCG TABS Take by mouth.     progesterone  (PROMETRIUM ) 100 MG capsule Take 1 capsule (100 mg total) by mouth daily.  Take at bedtime. 90 capsule 3   VITAMIN D  PO Take 4,000 Int'l Units by mouth.     Zinc Sulfate (ZINC 15 PO) Take by mouth.     Current Facility-Administered Medications  Medication Dose Route Frequency Provider Last Rate Last Admin   denosumab (PROLIA) injection 60 mg  60 mg Subcutaneous Once Amundson C Silva, Maxwell Lemen E, MD       Romosozumab-aqqg (EVENITY) 105 MG/1. injection 210 mg  210 mg Subcutaneous Once Amundson C Silva, Charnell Peplinski E, MD         ALLERGIES: Patient has no known allergies.  Family History  Problem Relation Age of Onset   Osteopenia Mother    Asthma Father    Graves' disease Sister     Social History   Socioeconomic History   Marital status: Married    Spouse name: Not on file   Number of children: 1   Years of education: 34   Highest education level: Professional school degree (e.g., MD, DDS, DVM, JD)  Occupational  History    Employer: [employer]  Tobacco Use   Smoking status: Never   Smokeless tobacco: Never  Vaping Use   Vaping status: Never Used  Substance and Sexual Activity   Alcohol use: Yes    Comment: 2-4 drinks a week   Drug use: No   Sexual activity: Yes    Partners: Male    Birth control/protection: Post-menopausal    Comment: 1st intercourse- 17, partners- 5  Other Topics Concern   Not on file  Social History Narrative   Lives at home w/ her spouse and child   Right-handed   Drinks a cup of coffee each morning   Social Drivers of Corporate Investment Banker Strain: Low Risk  (12/25/2023)   Overall Financial Resource Strain (CARDIA)    Difficulty of Paying Living Expenses: Not hard at all  Food Insecurity: No Food Insecurity (12/25/2023)   Hunger Vital Sign    Worried About Running Out of Food in the Last Year: Never true    Ran Out of Food in the Last Year: Never true  Transportation Needs: No Transportation Needs (12/25/2023)   PRAPARE - Administrator, Civil Service (Medical): No    Lack of Transportation (Non-Medical): No  Physical Activity: Inactive (12/25/2023)   Exercise Vital Sign    Days of Exercise per Week: 4 days    Minutes of Exercise per Session: 0 min  Stress: No Stress Concern Present (12/25/2023)   [employer] of Occupational Health - Occupational Stress Questionnaire    Feeling of Stress: Not at all  Social Connections: Socially Integrated (12/25/2023)   Social Connection and Isolation Panel    Frequency of Communication with Friends and Family: More than three times a week    Frequency of Social Gatherings with Friends and Family: Twice a week    Attends Religious Services: More than 4 times per year    Active Member of [employer] or Organizations: No    Attends Engineer, Structural: More than 4 times per year    Marital Status: Married  Catering Manager Violence: Not At Risk (12/25/2023)   Humiliation, Afraid, Rape, and  Kick questionnaire    Fear of Current or Ex-Partner: No    Emotionally Abused: No    Physically Abused: No    Sexually Abused: No    Review of Systems  All other systems reviewed and are negative.   PHYSICAL EXAMINATION:   BP 110/74  Pulse 78   LMP 07/29/2019 Comment: sexually active  SpO2 99%     General appearance: alert, cooperative and appears stated age   ASSESSMENT:  Osteoporosis with history of prior fractures.    PLAN:  We had a comprehensive discussion of treatment options.   Treatment goal is Evenity.  Patient would like to pursue pharmacy benefit or an appeal for the Evenity.  Her insurance will change in the new year, and our goal may be to establish her care with this medication this year.   Vit D 4000 int units daily. Ca 1000 mg daily.  Weight bearing exercise.   Reduce risk of falls.   Continue HRT.   35 min  total time was spent for this patient encounter, including preparation, face-to-face counseling with the patient, coordination of care, and documentation of the encounter.

## 2023-12-26 NOTE — Telephone Encounter (Signed)
 Insurance requires a ccontraindication, intolerance, or ineffective response to Prolia before coverage of Evenity. After review of patient's chart, looks like office has already tried to get Niland approved. Please advise.

## 2023-12-26 NOTE — Telephone Encounter (Signed)
 Please contact patient today if possible regarding Evenity for treatment of osteoporosis.   Thank you.

## 2023-12-26 NOTE — Telephone Encounter (Signed)
 Thank you! Will submit to [facility] for benefits verification.    Call to patient. Patient advised referral sent for Evenity and can look to hear from [facility]. Patient verbalized understanding.

## 2023-12-26 NOTE — Telephone Encounter (Signed)
 She would like to proceed this year if possible.  We are hopeful that starting this year may allow continuation also next year as she will be in her treatment process.   She has already sustained fractures due to her osteoporosis.

## 2023-12-30 NOTE — Telephone Encounter (Signed)
Routing to Dr. Silva to review and advise.  

## 2023-12-30 NOTE — Telephone Encounter (Signed)
 Spoke with patient. Patient is adamant about receiving Evenity prior to the end of the year as it was Dr. Cyrilla recommendation. RN again advised of appeal process requiring 30-60 days. Patient states she will contact office tomorrow for update.

## 2023-12-31 NOTE — Telephone Encounter (Signed)
 If appeal will take 30 - 60 days, patient may not be able to start Evenity before her insurance changes.   Her options would be to start Prolia or wait until next year with new insurance to try for Evenity, which may not be a guarantee of approval with new insurance.

## 2023-12-31 NOTE — Telephone Encounter (Signed)
 Please proceed with appeal for Evenity.

## 2024-01-01 NOTE — Telephone Encounter (Signed)
 PA submitted via [employer]. Authorization Number : [id number]

## 2024-01-02 DIAGNOSIS — S22060D Wedge compression fracture of T7-T8 vertebra, subsequent encounter for fracture with routine healing: Secondary | ICD-10-CM | POA: Diagnosis not present

## 2024-01-02 NOTE — Telephone Encounter (Signed)
 PA DENIED

## 2024-01-02 NOTE — Telephone Encounter (Signed)
 Information has been sent to clinical pharmacist for appeals review. It may take 5-7 days to prepare the necessary documentation to request the appeal from the insurance.

## 2024-01-03 ENCOUNTER — Telehealth: Payer: Self-pay | Admitting: Pharmacist

## 2024-01-03 NOTE — Telephone Encounter (Signed)
 Insurance denied the request for Evenity  because the patient has not tried and failed Prolia . To submit an appeal, please provide a clinical rationale explaining why the patient is unable to use Prolia .   Thank you, Devere Pandy, PharmD Clinical Pharmacist  [place]  Direct Dial: [phone]

## 2024-01-03 NOTE — Telephone Encounter (Signed)
 Please contact patient to let her know that an appeal for Evenity  will not be accepted because there no current contraindication for Prolia .   Prescription drug preference apparently is not sufficient for selecting Evenity .   Patient may start Prolia  or wait until next year when she has new insurance to see if her new policy will accept Evenity .  Trying again next year will not be a guarantee that Evenity  would be approved.

## 2024-01-06 ENCOUNTER — Telehealth: Payer: Self-pay

## 2024-01-06 NOTE — Telephone Encounter (Signed)
 See telephone encounter dated 01/03/24.

## 2024-01-06 NOTE — Telephone Encounter (Signed)
 Call to patient. Message given to patient as seen below from Dr. Nikki. Patient verbalized understanding. Patient states she would like to decide what she would like to do and return call to office once she makes a decision on how she would like to proceed. Patient's insurance will also be changing with the new calendar year.

## 2024-01-06 NOTE — Telephone Encounter (Signed)
 Patient called triage line & wants to speak with [facility] for follow up on Evenity .

## 2024-01-15 NOTE — Telephone Encounter (Signed)
 Patient called and stated she would like to talk to [facility] regarding bone support medication.

## 2024-01-16 NOTE — Telephone Encounter (Signed)
 See telephone encounter dated 01/03/24.

## 2024-01-16 NOTE — Telephone Encounter (Signed)
 Returned call to patient. Patient states she would like to hold off and try for the Evenity  with the start of the new year. Unsure yet what insurance she will have, but is looking into an Risk Analyst.

## 2024-01-29 DIAGNOSIS — K5730 Diverticulosis of large intestine without perforation or abscess without bleeding: Secondary | ICD-10-CM | POA: Diagnosis not present

## 2024-01-29 DIAGNOSIS — Z1211 Encounter for screening for malignant neoplasm of colon: Secondary | ICD-10-CM | POA: Diagnosis not present

## 2024-02-17 ENCOUNTER — Encounter: Payer: Self-pay | Admitting: Obstetrics and Gynecology

## 2024-02-18 ENCOUNTER — Other Ambulatory Visit (HOSPITAL_COMMUNITY): Payer: Self-pay

## 2024-02-18 DIAGNOSIS — M810 Age-related osteoporosis without current pathological fracture: Secondary | ICD-10-CM

## 2024-02-18 MED ORDER — ROMOSOZUMAB-AQQG 105 MG/1.17ML ~~LOC~~ SOSY
210.0000 mg | PREFILLED_SYRINGE | SUBCUTANEOUS | Status: AC
  Administered 2024-03-12: 210 mg via SUBCUTANEOUS

## 2024-02-18 NOTE — Telephone Encounter (Signed)
 Evenity VOB initiated via [email or web address]  Last Evenity inj:  Next Evenity inj DUE:  NEW START

## 2024-02-26 ENCOUNTER — Other Ambulatory Visit (HOSPITAL_COMMUNITY): Payer: Self-pay

## 2024-02-27 ENCOUNTER — Other Ambulatory Visit (HOSPITAL_COMMUNITY): Payer: Self-pay

## 2024-03-03 ENCOUNTER — Other Ambulatory Visit (HOSPITAL_COMMUNITY): Payer: Self-pay

## 2024-03-03 NOTE — Telephone Encounter (Signed)
" ° °  Directv, spoke to multiple representatives. Per one representative, 25% coinsurance, PA required. Filled out PA form and faxed back to [phone].   Phone: [phone] "

## 2024-03-04 ENCOUNTER — Other Ambulatory Visit (HOSPITAL_COMMUNITY): Payer: Self-pay

## 2024-03-04 NOTE — Telephone Encounter (Signed)
 Buy/Bill (Office supplied medication)  Out-of-pocket cost due at time of clinic visit: $600  Number of injection/visits approved: 12  Primary: [employer] Co-insurance: 25% Admin fee co-insurance: 0%  Secondary: --- Co-insurance:  Admin fee co-insurance:   Medical Benefit Details: Date Benefits were checked: 03/03/24 Deductible: NO/ Coinsurance: 25%/ Admin Fee: 0%  Prior Auth: APPROVED PA# [id number] Expiration Date: 03/03/24-03/03/25  # of doses approved: 12 -----------------------------------------------------------------------  Patient IS eligible for Copay Card. Copay Card can make patient's cost as little as $25. Link to apply: [email or web address]://[email or web address]  ** This summary of benefits is an estimation of the patient's out-of-pocket cost. Exact cost may very based on individual plan coverage.

## 2024-03-04 NOTE — Telephone Encounter (Signed)
"  PA APPROVED  "

## 2024-03-06 ENCOUNTER — Other Ambulatory Visit: Payer: Self-pay | Admitting: *Deleted

## 2024-03-06 DIAGNOSIS — M810 Age-related osteoporosis without current pathological fracture: Secondary | ICD-10-CM

## 2024-03-06 MED ORDER — ROMOSOZUMAB-AQQG 105 MG/1.17ML ~~LOC~~ SOSY: 210.0000 mg | PREFILLED_SYRINGE | SUBCUTANEOUS | Status: AC

## 2024-03-06 NOTE — Telephone Encounter (Signed)
 See referral

## 2024-03-12 ENCOUNTER — Ambulatory Visit

## 2024-03-12 DIAGNOSIS — M810 Age-related osteoporosis without current pathological fracture: Secondary | ICD-10-CM | POA: Insufficient documentation

## 2024-03-12 NOTE — Progress Notes (Signed)
 First Evenity  injections - One injection given SQ in each arm. Pt tolerated injections well.  (IC, CCMA)

## 2024-04-13 ENCOUNTER — Ambulatory Visit

## 2024-05-14 ENCOUNTER — Ambulatory Visit

## 2024-05-15 ENCOUNTER — Ambulatory Visit

## 2024-08-06 ENCOUNTER — Ambulatory Visit: Admitting: Obstetrics and Gynecology

## 2024-12-02 ENCOUNTER — Encounter (INDEPENDENT_AMBULATORY_CARE_PROVIDER_SITE_OTHER): Admitting: Ophthalmology
